# Patient Record
Sex: Female | Born: 1990 | Race: Black or African American | Hispanic: No | Marital: Single | Smoking: Never smoker
Health system: Southern US, Community
[De-identification: ages and names within clinical notes are randomized; demographics above are authoritative.]

## PROBLEM LIST (undated history)

## (undated) ENCOUNTER — Inpatient Hospital Stay (HOSPITAL_COMMUNITY): Payer: Self-pay

## (undated) DIAGNOSIS — Z9889 Other specified postprocedural states: Secondary | ICD-10-CM

## (undated) DIAGNOSIS — R87629 Unspecified abnormal cytological findings in specimens from vagina: Secondary | ICD-10-CM

## (undated) DIAGNOSIS — T8859XA Other complications of anesthesia, initial encounter: Secondary | ICD-10-CM

## (undated) DIAGNOSIS — R112 Nausea with vomiting, unspecified: Secondary | ICD-10-CM

## (undated) DIAGNOSIS — T4145XA Adverse effect of unspecified anesthetic, initial encounter: Secondary | ICD-10-CM

## (undated) HISTORY — DX: Unspecified abnormal cytological findings in specimens from vagina: R87.629

## (undated) HISTORY — PX: LEEP: SHX91

---
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
## 2015-09-27 ENCOUNTER — Ambulatory Visit
Admission: RE | Admit: 2015-09-27 | Discharge: 2015-09-27 | Disposition: A | Discharge status: Home / Self Care | Payer: No Typology Code available for payment source | Source: Ambulatory Visit | Attending: Infectious Disease | Admitting: Infectious Disease

## 2015-09-27 ENCOUNTER — Other Ambulatory Visit: Payer: Self-pay | Admitting: Infectious Disease

## 2015-09-27 DIAGNOSIS — Z111 Encounter for screening for respiratory tuberculosis: Secondary | ICD-10-CM

## 2016-01-27 NOTE — L&D Delivery Note (Signed)
Melody Zamora is a 26 y.o. G1P0 at [redacted]w[redacted]d. She presented with PPROM. No measurable cervix. She continued to have cramping and pain. At 2325 she vomited and felt an urge to push. She delivered a non-viable fetus. Fetus with cardiac activity, and some small movements of the arms and legs.  Placenta delivered intact at 2340. of cytotec given buccally.   0056 heart stopped beating.   Mom will be DC home after a short period of observation. Baby to morgue.    Thressa Sheller  11:51 PM 05/27/16

## 2016-04-27 DIAGNOSIS — R7612 Nonspecific reaction to cell mediated immunity measurement of gamma interferon antigen response without active tuberculosis: Secondary | ICD-10-CM | POA: Diagnosis not present

## 2016-04-27 DIAGNOSIS — Z3401 Encounter for supervision of normal first pregnancy, first trimester: Secondary | ICD-10-CM | POA: Diagnosis not present

## 2016-04-27 DIAGNOSIS — O99810 Abnormal glucose complicating pregnancy: Secondary | ICD-10-CM | POA: Diagnosis not present

## 2016-04-27 DIAGNOSIS — O99210 Obesity complicating pregnancy, unspecified trimester: Secondary | ICD-10-CM | POA: Diagnosis not present

## 2016-05-27 ENCOUNTER — Observation Stay (HOSPITAL_COMMUNITY)
Admission: AD | Admit: 2016-05-27 | Discharge: 2016-05-28 | Disposition: A | Discharge status: Home / Self Care | Payer: Medicaid Other | Source: Ambulatory Visit | Attending: Obstetrics & Gynecology | Admitting: Obstetrics & Gynecology

## 2016-05-27 ENCOUNTER — Inpatient Hospital Stay (HOSPITAL_COMMUNITY): Payer: Medicaid Other

## 2016-05-27 ENCOUNTER — Encounter (HOSPITAL_COMMUNITY): Payer: Self-pay | Admitting: *Deleted

## 2016-05-27 DIAGNOSIS — R7611 Nonspecific reaction to tuberculin skin test without active tuberculosis: Secondary | ICD-10-CM

## 2016-05-27 DIAGNOSIS — Z3A17 17 weeks gestation of pregnancy: Secondary | ICD-10-CM | POA: Insufficient documentation

## 2016-05-27 DIAGNOSIS — O42919 Preterm premature rupture of membranes, unspecified as to length of time between rupture and onset of labor, unspecified trimester: Secondary | ICD-10-CM | POA: Diagnosis present

## 2016-05-27 DIAGNOSIS — O039 Complete or unspecified spontaneous abortion without complication: Secondary | ICD-10-CM | POA: Diagnosis present

## 2016-05-27 DIAGNOSIS — R87619 Unspecified abnormal cytological findings in specimens from cervix uteri: Secondary | ICD-10-CM | POA: Insufficient documentation

## 2016-05-27 DIAGNOSIS — O42912 Preterm premature rupture of membranes, unspecified as to length of time between rupture and onset of labor, second trimester: Secondary | ICD-10-CM | POA: Diagnosis present

## 2016-05-27 DIAGNOSIS — O42012 Preterm premature rupture of membranes, onset of labor within 24 hours of rupture, second trimester: Secondary | ICD-10-CM | POA: Diagnosis not present

## 2016-05-27 DIAGNOSIS — Z603 Acculturation difficulty: Secondary | ICD-10-CM

## 2016-05-27 HISTORY — DX: Preterm premature rupture of membranes, unspecified as to length of time between rupture and onset of labor, unspecified trimester: O42.919

## 2016-05-27 HISTORY — DX: Nonspecific reaction to tuberculin skin test without active tuberculosis: R76.11

## 2016-05-27 LAB — CBC
HCT: 37.8 % (ref 36.0–46.0)
HEMOGLOBIN: 13.2 g/dL (ref 12.0–15.0)
MCH: 31.3 pg (ref 26.0–34.0)
MCHC: 34.9 g/dL (ref 30.0–36.0)
MCV: 89.6 fL (ref 78.0–100.0)
PLATELETS: 271 10*3/uL (ref 150–400)
RBC: 4.22 MIL/uL (ref 3.87–5.11)
RDW: 14.4 % (ref 11.5–15.5)
WBC: 14.1 10*3/uL — AB (ref 4.0–10.5)

## 2016-05-27 MED ORDER — MISOPROSTOL 200 MCG PO TABS
800.0000 ug | ORAL_TABLET | Freq: Once | ORAL | Status: AC
  Administered 2016-05-27: 800 ug via BUCCAL
  Filled 2016-05-27: qty 4

## 2016-05-27 MED ORDER — HYDROXYZINE HCL 50 MG PO TABS
50.0000 mg | ORAL_TABLET | Freq: Four times a day (QID) | ORAL | Status: DC | PRN
  Filled 2016-05-27: qty 1

## 2016-05-27 MED ORDER — NALBUPHINE HCL 10 MG/ML IJ SOLN: 5.0000 mg | INTRAMUSCULAR | Status: DC | PRN

## 2016-05-27 MED ORDER — PROMETHAZINE HCL 25 MG/ML IJ SOLN
12.5000 mg | Freq: Four times a day (QID) | INTRAMUSCULAR | Status: DC | PRN
Start: 2016-05-27 — End: 2016-05-28

## 2016-05-27 MED ORDER — SOD CITRATE-CITRIC ACID 500-334 MG/5ML PO SOLN: 30.0000 mL | ORAL | Status: DC | PRN

## 2016-05-27 MED ORDER — LIDOCAINE HCL (PF) 1 % IJ SOLN
30.0000 mL | INTRAMUSCULAR | Status: DC | PRN
  Filled 2016-05-27: qty 30

## 2016-05-27 MED ORDER — OXYCODONE-ACETAMINOPHEN 5-325 MG PO TABS: 1.0000 | ORAL_TABLET | ORAL | Status: DC | PRN

## 2016-05-27 MED ORDER — OXYTOCIN BOLUS FROM INFUSION
500.0000 mL | Freq: Once | INTRAVENOUS | Status: AC
  Administered 2016-05-28: 500 mL via INTRAVENOUS

## 2016-05-27 MED ORDER — PROMETHAZINE HCL 25 MG PO TABS
25.0000 mg | ORAL_TABLET | Freq: Once | ORAL | Status: AC
  Administered 2016-05-27: 25 mg via ORAL
  Filled 2016-05-27: qty 1

## 2016-05-27 MED ORDER — ONDANSETRON HCL 4 MG/2ML IJ SOLN
4.0000 mg | Freq: Four times a day (QID) | INTRAMUSCULAR | Status: DC | PRN
Start: 2016-05-27 — End: 2016-05-28

## 2016-05-27 MED ORDER — HYDROMORPHONE HCL 1 MG/ML IJ SOLN
0.5000 mg | Freq: Once | INTRAMUSCULAR | Status: AC
Start: 2016-05-27 — End: 2016-05-27
  Administered 2016-05-27: 0.5 mg via INTRAMUSCULAR
  Filled 2016-05-27: qty 1

## 2016-05-27 MED ORDER — ACETAMINOPHEN 325 MG PO TABS: 650.0000 mg | ORAL_TABLET | ORAL | Status: DC | PRN

## 2016-05-27 MED ORDER — OXYCODONE-ACETAMINOPHEN 5-325 MG PO TABS
2.0000 | ORAL_TABLET | ORAL | Status: DC | PRN
  Administered 2016-05-28: 2 via ORAL
  Filled 2016-05-27: qty 2

## 2016-05-27 MED ORDER — LACTATED RINGERS IV SOLN
INTRAVENOUS | Status: DC
  Administered 2016-05-27: via INTRAVENOUS

## 2016-05-27 MED ORDER — OXYTOCIN 40 UNITS IN LACTATED RINGERS INFUSION - SIMPLE MED
2.5000 [IU]/h | INTRAVENOUS | Status: DC
  Filled 2016-05-27: qty 1000

## 2016-05-27 MED ORDER — LACTATED RINGERS IV SOLN: 500.0000 mL | INTRAVENOUS | Status: DC | PRN

## 2016-05-27 NOTE — H&P (Signed)
Melody Zamora is a 26 y.o. female presenting for Leaking fluid and uterine cramping since this morning.  Has had care at [facility] Department  From [place].  Family is all there.  Is here with friend from work.  Patient states that she has not had intercourse in the last 24 hours.  . OB History    Gravida Para Term Preterm AB Living   1             SAB TAB Ectopic Multiple Live Births                 History reviewed. No pertinent past medical history. History reviewed. No pertinent surgical history. Family History: family history is not on file. Social History:  reports that she has never smoked. She has never used smokeless tobacco. She reports that she does not drink alcohol or use drugs.   Review of Systems  Constitutional: Negative for chills, fever and malaise/fatigue.  Respiratory: Negative for shortness of breath.   Cardiovascular: Negative for leg swelling.  Gastrointestinal: Positive for abdominal pain. Negative for constipation, diarrhea and vomiting.  Genitourinary: Negative for dysuria.       Fluid leaking and cramping started this morning  Neurological: Negative for dizziness and headaches.   Maternal Medical History:  Reason for admission: Rupture of membranes and contractions.   Contractions: Onset was 6-12 hours ago.   Frequency: regular.   Perceived severity is moderate.    Fetal activity: Perceived fetal activity is normal.   Last perceived fetal movement was within the past hour.    Prenatal complications: PIH.   No bleeding or pre-eclampsia.   Prenatal Complications - Diabetes: none.      Blood pressure 127/90, pulse 96, temperature 98.2 F (36.8 C), temperature source Oral, resp. rate 18, height 5' 4.5" (1.638 m), weight 259 lb (117.5 kg). Maternal Exam:  Uterine Assessment: Contraction strength is firm.  Contraction frequency is irregular.   Abdomen: Patient reports generalized tenderness.  Fundal height is 20.   Fetal presentation: no  presenting part  Introitus: Normal vulva. Vagina is positive for vaginal discharge (watery discharge, thin, light green/yellow, + Pooling, + ferning).  Ferning test: positive.  Nitrazine test: not done. Amniotic fluid character: clear and meconium stained.  Pelvis: adequate for delivery.   Cervix: Cervix evaluated by sterile speculum exam.     Fetal Exam Fetal Monitor Review: Mode: hand-held doppler probe.   Baseline rate: 143.      Physical Exam  Constitutional: She is oriented to person, place, and time. She appears well-developed and well-nourished. She appears distressed (tearful, uncomfortable, breathing through contractions).  HENT:  Head: Normocephalic.  Cardiovascular: Normal rate and regular rhythm.   Respiratory: Effort normal. No respiratory distress.  GI: She exhibits no distension. There is generalized tenderness. There is no rebound and no guarding.  Genitourinary: Vaginal discharge (watery discharge, thin, light green/yellow, + Pooling, + ferning) found.  Genitourinary Comments: Did not do digital exam Appears closed but difficult to see  Musculoskeletal: Normal range of motion.  Neurological: She is alert and oriented to person, place, and time.  Skin: Skin is warm and dry.  Psychiatric: She has a normal mood and affect.    Prenatal labs: ABO, Rh:   Antibody:   Rubella:   RPR:    HBsAg:    HIV:    GBS:     Assessment/Plan: Single IUP at [redacted]w[redacted]d by LMP PPROM Uterine cramping, appears to be laboring  Per Dr Despina Hidden get [place]  for measurements and fluid Probable admission   Wynelle Bourgeois 05/27/2016, 10:06 PM

## 2016-05-27 NOTE — MAU Note (Signed)
Pt reports leaking of fluid since 1100 am lower abdominal pain off and on throughout the day. Denies bleeding. Pain 9/10

## 2016-05-28 ENCOUNTER — Encounter (HOSPITAL_COMMUNITY): Payer: Self-pay | Admitting: *Deleted

## 2016-05-28 DIAGNOSIS — O42012 Preterm premature rupture of membranes, onset of labor within 24 hours of rupture, second trimester: Secondary | ICD-10-CM | POA: Diagnosis not present

## 2016-05-28 DIAGNOSIS — O039 Complete or unspecified spontaneous abortion without complication: Secondary | ICD-10-CM

## 2016-05-28 DIAGNOSIS — O42912 Preterm premature rupture of membranes, unspecified as to length of time between rupture and onset of labor, second trimester: Secondary | ICD-10-CM | POA: Diagnosis not present

## 2016-05-28 DIAGNOSIS — Z3A17 17 weeks gestation of pregnancy: Secondary | ICD-10-CM | POA: Diagnosis not present

## 2016-05-28 DIAGNOSIS — O42919 Preterm premature rupture of membranes, unspecified as to length of time between rupture and onset of labor, unspecified trimester: Secondary | ICD-10-CM

## 2016-05-28 HISTORY — DX: Complete or unspecified spontaneous abortion without complication: O03.9

## 2016-05-28 LAB — TYPE AND SCREEN
ABO/RH(D): O POS
Antibody Screen: NEGATIVE

## 2016-05-28 LAB — RPR: RPR: NONREACTIVE

## 2016-05-28 LAB — ABO/RH: ABO/RH(D): O POS

## 2016-05-28 MED ORDER — MISOPROSTOL 200 MCG PO TABS: 200.0000 ug | ORAL_TABLET | Freq: Four times a day (QID) | ORAL | 0 refills | Status: DC

## 2016-05-28 MED ORDER — OXYTOCIN 10 UNIT/ML IJ SOLN: 10.0000 [IU] | Freq: Once | INTRAMUSCULAR | Status: DC

## 2016-05-28 MED ORDER — KETOROLAC TROMETHAMINE 10 MG PO TABS: 10.0000 mg | ORAL_TABLET | Freq: Three times a day (TID) | ORAL | 0 refills | Status: DC | PRN

## 2016-05-28 MED ORDER — OXYTOCIN 10 UNIT/ML IJ SOLN
INTRAMUSCULAR | Status: AC
  Administered 2016-05-28: 10 [IU] via INTRAVENOUS
  Filled 2016-05-28: qty 1

## 2016-05-28 NOTE — Progress Notes (Addendum)
Patient is about 3.5 hours postpartum. She is still having a slight trickle of bleeding at this time, fundus firm. She has had cytotec and pitocin. Patient does not have a ride available to come at this time, and does not have anyone that can be with her at home at this time. Will put in obs on 3rd floor to continue to monitor bleeding. Patient has also expressed interested in seeing the chaplin as well. Will facilitate this from 3rd floor.   Will plan for DC later this morning as long as bleeding stabilizes.   Thressa ShellerHeather Leeya Rusconi  2:51 AM 05/28/16

## 2016-05-28 NOTE — Discharge Summary (Signed)
Physician Discharge Summary  Patient ID: Melody Zamora MRN: [id number] DOB/AGE: 22-Dec-1990 26 y.o.  Admit date: 05/27/2016 Discharge date: 05/28/2016  Admission Diagnoses: 17 week pregnancy loss  Discharge Diagnoses:  Active Problems:   Language barrier, cultural differences   Preterm premature rupture of membranes (PPROM) with unknown onset of labor   SAB (spontaneous abortion)   Discharged Condition: good  Hospital Course: admitted with PROM and active pregnancy loss Underwent spontaneous loss after admission with spontaneous delivery of placenta as well  Consults: None  Significant Diagnostic Studies: labs: Rh positive  Treatments:   Discharge Exam: Blood pressure (!) 101/54, pulse 71, temperature 98 F (36.7 C), temperature source Oral, resp. rate 18, height 5' 4.5" (1.638 m), weight 259 lb (117.5 kg), SpO2 100 %. General appearance: alert, cooperative and no distress GI: soft, non-tender; bowel sounds normal; no masses,  no organomegaly  Disposition: Final discharge disposition not confirmed  Discharge Instructions    Call MD for:  persistant nausea and vomiting    Complete by:  As directed    Call MD for:  severe uncontrolled pain    Complete by:  As directed    Call MD for:  temperature >100.4    Complete by:  As directed    Diet - low sodium heart healthy    Complete by:  As directed    Sexual acrtivity    Complete by:  As directed    No sex for 4 weeks     Allergies as of 05/28/2016   No Known Allergies     Medication List    TAKE these medications   ketorolac 10 MG tablet Commonly known as:  TORADOL Take 1 tablet (10 mg total) by mouth every 8 (eight) hours as needed.   misoprostol 200 MCG tablet Commonly known as:  CYTOTEC Take 1 tablet (200 mcg total) by mouth 4 (four) times daily.   multivitamin-prenatal 27-0.8 MG Tabs tablet Take 1 tablet by mouth daily at 12 noon.      Follow-up Information    [facility]  [facility]   Contact information: [address] [place] [id number] [phone]           Signed: Lazaro ArmsURE,Rabiah Goeser H 05/28/2016, 7:30 AM

## 2016-05-28 NOTE — Progress Notes (Signed)
Patient discharged home with friends. Discharged paperwork, teaching, prescriptions, and follow-up appts reviewed with patient. No questions at this time. Patient removed her own IV and left without hospital escort.

## 2016-05-28 NOTE — Discharge Instructions (Signed)

## 2016-05-29 ENCOUNTER — Encounter (HOSPITAL_COMMUNITY): Payer: Self-pay | Admitting: *Deleted

## 2016-06-01 ENCOUNTER — Ambulatory Visit: Payer: Self-pay | Admitting: Family Medicine

## 2016-07-16 ENCOUNTER — Other Ambulatory Visit (HOSPITAL_COMMUNITY)
Admission: RE | Admit: 2016-07-16 | Discharge: 2016-07-16 | Disposition: A | Discharge status: Home / Self Care | Payer: Medicaid Other | Source: Ambulatory Visit | Attending: Family Medicine | Admitting: Family Medicine

## 2016-07-16 ENCOUNTER — Ambulatory Visit (INDEPENDENT_AMBULATORY_CARE_PROVIDER_SITE_OTHER): Payer: Medicaid Other | Admitting: Family Medicine

## 2016-07-16 ENCOUNTER — Other Ambulatory Visit (HOSPITAL_COMMUNITY)
Admission: RE | Admit: 2016-07-16 | Discharge: 2016-07-16 | Disposition: A | Discharge status: Home / Self Care | Payer: Medicaid Other | Source: Ambulatory Visit | Attending: Obstetrics & Gynecology | Admitting: Obstetrics & Gynecology

## 2016-07-16 ENCOUNTER — Encounter: Payer: Self-pay | Admitting: Family Medicine

## 2016-07-16 VITALS — BP 135/77 | HR 62 | Wt 247.6 lb

## 2016-07-16 DIAGNOSIS — Z01411 Encounter for gynecological examination (general) (routine) with abnormal findings: Secondary | ICD-10-CM | POA: Diagnosis present

## 2016-07-16 DIAGNOSIS — R87612 Low grade squamous intraepithelial lesion on cytologic smear of cervix (LGSIL): Secondary | ICD-10-CM | POA: Diagnosis present

## 2016-07-16 DIAGNOSIS — Z3202 Encounter for pregnancy test, result negative: Secondary | ICD-10-CM

## 2016-07-16 DIAGNOSIS — Z124 Encounter for screening for malignant neoplasm of cervix: Secondary | ICD-10-CM

## 2016-07-16 LAB — POCT PREGNANCY, URINE: PREG TEST UR: NEGATIVE

## 2016-07-16 NOTE — Patient Instructions (Signed)
Colposcopy, Care After  This sheet gives you information about how to care for yourself after your procedure. Your doctor may also give you more specific instructions. If you have problems or questions, contact your doctor.  What can I expect after the procedure?  If you did not have a tissue sample removed (did not have a biopsy), you may only have some spotting for a few days. You can go back to your normal activities.  If you had a tissue sample removed, it is common to have:  · Soreness and pain. This may last for a few days.  · Light-headedness.  · Mild bleeding from your vagina or dark-colored, grainy discharge from your vagina. This may last for a few days. You may need to wear a sanitary pad.  · Spotting for at least 48 hours after the procedure.    Follow these instructions at home:  · Take over-the-counter and prescription medicines only as told by your doctor. Ask your doctor what medicines you can start taking again. This is very important if you take blood-thinning medicine.  · Do not drive or use heavy machinery while taking prescription pain medicine.  · For 3 days, or as long as your doctor tells you, avoid:  ? Douching.  ? Using tampons.  ? Having sex.  · If you use birth control (contraception), keep using it.  · Limit activity for the first day after the procedure. Ask your doctor what activities are safe for you.  · It is up to you to get the results of your procedure. Ask your doctor when your results will be ready.  · Keep all follow-up visits as told by your doctor. This is important.  Contact a doctor if:  · You get a skin rash.  Get help right away if:  · You are bleeding a lot from your vagina. It is a lot of bleeding if you are using more than one pad an hour for 2 hours in a row.  · You have clumps of blood (blood clots) coming from your vagina.  · You have a fever.  · You have chills  · You have pain in your lower belly (pelvic area).  · You have signs of infection, such as vaginal  discharge that is:  ? Different than usual.  ? Yellow.  ? Bad-smelling.  · You have very pain or cramps in your lower belly that do not get better with medicine.  · You feel light-headed.  · You feel dizzy.  · You pass out (faint).  Summary  · If you did not have a tissue sample removed (did not have a biopsy), you may only have some spotting for a few days. You can go back to your normal activities.  · If you had a tissue sample removed, it is common to have mild pain and spotting for 48 hours.  · For 3 days, or as long as your doctor tells you, avoid douching, using tampons and having sex.  · Get help right away if you have bleeding, very bad pain, or signs of infection.  This information is not intended to replace advice given to you by your health care provider. Make sure you discuss any questions you have with your health care provider.  Document Released: 07/01/2007 Document Revised: 10/02/2015 Document Reviewed: 10/02/2015  Elsevier Interactive Patient Education © 2018 Elsevier Inc.

## 2016-07-16 NOTE — Progress Notes (Signed)
GYNECOLOGY CLINIC COLPOSCOPY VISIT AND PROCEDURE NOTE  26 y.o. G1P0000 here for colposcopy for low-grade squamous intraepithelial neoplasia (LGSIL - encompassing HPV,mild dysplasia,CIN I) pap smear on 04/27/16, during early pregnancy. Prior cervical cytology and/or colposcopy findings: NONE. The patient reports the following prior treatments to the vulva/vagina/cervix: NONE. The patient is not a cigarette smoker. The patient is not immunosuppressed. The patient is not pregnant. The patient is not taking anticoagulants and is not allergic to iodine.  The risks (including infection, bleeding, pain) and benefits of the procedure were explained to the patient and written informed consent was obtained. Patient given informed consent, signed copy in the chart, time out was performed. Urine pregnancy test performed and confirmed to be negative.  Placed in lithotomy position. Repeat cervical cytology with HPV if indicated was obtained.   Gross findings:   Vagina: Normal mucosa  Vulva: Normal external genitalia without lesions  Cervix: Pink, nulliparous  Visualization after:  Acetic acid: Mosaicism noted around 6 o'clock to 9 o'clock  Lugol's solution: Satellite lesions at 12 o'clock and 2 o'clock  Green or blue filter: N/A  Upper one-third of vagina examined, revealing: normal vaginal mucosa  Biopsies obtained: Cervix at 12 o'clock, 2 o'clock, and 8 o'clock  ECC specimen obtained: YES  Colposcopy adequate? Yes  All specimens were labelled and sent to pathology.   Patient was given post procedure instructions.  Will follow up pathology and manage accordingly.      Jen MowElizabeth Mumaw, DO OB/GYN Fellow [facility](Faculty Practice)

## 2016-07-21 LAB — CYTOLOGY - PAP

## 2017-11-02 LAB — OB RESULTS CONSOLE RPR: RPR: NONREACTIVE

## 2017-11-02 LAB — OB RESULTS CONSOLE ANTIBODY SCREEN: ANTIBODY SCREEN: NEGATIVE

## 2017-11-02 LAB — OB RESULTS CONSOLE HEPATITIS B SURFACE ANTIGEN: HEP B S AG: NEGATIVE

## 2017-11-02 LAB — OB RESULTS CONSOLE HIV ANTIBODY (ROUTINE TESTING): HIV: NONREACTIVE

## 2017-11-02 LAB — OB RESULTS CONSOLE ABO/RH: RH TYPE: POSITIVE

## 2017-11-02 LAB — OB RESULTS CONSOLE RUBELLA ANTIBODY, IGM: Rubella: IMMUNE

## 2017-11-29 NOTE — H&P (Addendum)
Ghina Dimaria is a 27 y.o. female presenting for cervical cerclage. OB History    Gravida  1   Para  1   Term      Preterm      AB  0   Living  0     SAB  0   TAB      Ectopic      Multiple      Live Births  1          No past medical history on file. No past surgical history on file. Family History: family history is not on file. Social History:  reports that she has never smoked. She has never used smokeless tobacco. She reports that she does not drink alcohol or use drugs.     Maternal Diabetes: No Genetic Screening: Normal Maternal Ultrasounds/Referrals: Normal Fetal Ultrasounds or other Referrals:  None Maternal Substance Abuse:  No Significant Maternal Medications:  None Significant Maternal Lab Results:  None Other Comments:  None  ROS History   There were no vitals taken for this visit. Exam Physical Exam  Gen: well appearing, NAD CV: Reg rate Pulm: NWOB Abd: soft, nondistended, nontender, no masses GYN: uterus enlarged to appropriate gravid size, no adnexa ttp/CMT Ext: No edema b/l  Prenatal labs: ABO, Rh:  O+ Antibody:  neg Rubella:  immune RPR:   neg HBsAg:   neg HIV:   neg GBS:  unknown   Assessment/Plan: 27 yo G2P0020 @ 14. wga presenting for scheduled cerclage s/s second trimester loss at ~ 17 wga. Risks and benefits discussed in office including alternatives and patient elects history indicated cervical cerclage. Viability was confirmed at last office visit. G/C was negative at NOB visit. She has no absolute contraindications for a cerclage.  Risks of cerclage were discussed including chance of Infection, bleeding, ROM, suture migration, cervical dystocia/trauma, and failure. All questions answered and consent was given. No abx is indicated.   She is RH pos Proceed with cerclage.     Ranae Pila 11/29/2017, 9:32 AM  No updates to above H&P. Patient arrived NPO and was consented in PACU. Risks again discussed, all  questions answered, and consent signed. Proceed with above surgery.   Belva Agee MD

## 2017-12-01 ENCOUNTER — Telehealth (HOSPITAL_COMMUNITY): Payer: Self-pay | Admitting: *Deleted

## 2017-12-01 ENCOUNTER — Encounter (HOSPITAL_COMMUNITY): Payer: Self-pay | Admitting: *Deleted

## 2017-12-01 NOTE — Telephone Encounter (Signed)
Preadmission screen  

## 2017-12-07 ENCOUNTER — Telehealth (HOSPITAL_COMMUNITY): Payer: Self-pay | Admitting: *Deleted

## 2017-12-07 NOTE — Telephone Encounter (Signed)
Preadmission screen  

## 2017-12-08 ENCOUNTER — Encounter (HOSPITAL_COMMUNITY): Payer: Self-pay

## 2017-12-08 ENCOUNTER — Telehealth (HOSPITAL_COMMUNITY): Payer: Self-pay | Admitting: *Deleted

## 2017-12-08 NOTE — Telephone Encounter (Signed)
Preadmission screen  

## 2017-12-14 ENCOUNTER — Encounter (HOSPITAL_COMMUNITY)
Admission: RE | Admit: 2017-12-14 | Discharge: 2017-12-14 | Disposition: A | Discharge status: Home / Self Care | Payer: Medicaid Other | Source: Ambulatory Visit

## 2017-12-14 NOTE — Anesthesia Preprocedure Evaluation (Addendum)
Anesthesia Evaluation  Patient identified by MRN, date of birth, ID band Patient awake    Reviewed: Allergy & Precautions, NPO status , Patient's Chart, lab work & pertinent test results  History of Anesthesia Complications Negative for: history of anesthetic complications  Airway Mallampati: II  TM Distance: >3 FB Neck ROM: Full    Dental no notable dental hx. (+) Teeth Intact, Dental Advisory Given   Pulmonary neg pulmonary ROS,    Pulmonary exam normal breath sounds clear to auscultation       Cardiovascular negative cardio ROS Normal cardiovascular exam Rhythm:Regular Rate:Normal     Neuro/Psych negative neurological ROS     GI/Hepatic negative GI ROS, Neg liver ROS,   Endo/Other  Morbid obesityBMI 46.7  Renal/GU negative Renal ROS     Musculoskeletal negative musculoskeletal ROS (+)   Abdominal   Peds  Hematology negative hematology ROS (+)   Anesthesia Other Findings Day of surgery medications reviewed with the patient.  Reproductive/Obstetrics (+) Pregnancy                           Anesthesia Physical Anesthesia Plan  ASA: III  Anesthesia Plan: Spinal   Post-op Pain Management:    Induction:   PONV Risk Score and Plan: 2 and Treatment may vary due to age or medical condition  Airway Management Planned: Natural Airway and Nasal Cannula  Additional Equipment:   Intra-op Plan:   Post-operative Plan:   Informed Consent: I have reviewed the patients History and Physical, chart, labs and discussed the procedure including the risks, benefits and alternatives for the proposed anesthesia with the patient or authorized representative who has indicated his/her understanding and acceptance.   Dental advisory given  Plan Discussed with: CRNA  Anesthesia Plan Comments:        Anesthesia Quick Evaluation

## 2017-12-15 ENCOUNTER — Encounter (HOSPITAL_COMMUNITY)
Admission: RE | Disposition: A | Discharge status: Home / Self Care | Payer: Self-pay | Source: Ambulatory Visit | Attending: Obstetrics and Gynecology

## 2017-12-15 ENCOUNTER — Encounter (HOSPITAL_COMMUNITY): Payer: Self-pay | Admitting: *Deleted

## 2017-12-15 ENCOUNTER — Inpatient Hospital Stay (HOSPITAL_COMMUNITY): Payer: Medicaid Other | Admitting: Anesthesiology

## 2017-12-15 ENCOUNTER — Observation Stay (HOSPITAL_COMMUNITY)
Admission: RE | Admit: 2017-12-15 | Discharge: 2017-12-15 | Disposition: A | Discharge status: Home / Self Care | Payer: Medicaid Other | Source: Ambulatory Visit | Attending: Obstetrics and Gynecology | Admitting: Obstetrics and Gynecology

## 2017-12-15 DIAGNOSIS — O3430 Maternal care for cervical incompetence, unspecified trimester: Secondary | ICD-10-CM | POA: Diagnosis present

## 2017-12-15 DIAGNOSIS — Z3A17 17 weeks gestation of pregnancy: Secondary | ICD-10-CM | POA: Diagnosis not present

## 2017-12-15 DIAGNOSIS — N883 Incompetence of cervix uteri: Principal | ICD-10-CM | POA: Insufficient documentation

## 2017-12-15 HISTORY — PX: CERVICAL CERCLAGE: SHX1329

## 2017-12-15 HISTORY — DX: Maternal care for cervical incompetence, unspecified trimester: O34.30

## 2017-12-15 LAB — TYPE AND SCREEN
ABO/RH(D): O POS
Antibody Screen: NEGATIVE

## 2017-12-15 SURGERY — CERCLAGE, CERVIX, VAGINAL APPROACH
Anesthesia: Spinal | Site: Vagina | Wound class: Clean Contaminated

## 2017-12-15 MED ORDER — ACETAMINOPHEN 10 MG/ML IV SOLN
INTRAVENOUS | Status: AC
  Filled 2017-12-15: qty 100

## 2017-12-15 MED ORDER — OXYCODONE HCL 5 MG PO TABS: 5.0000 mg | ORAL_TABLET | Freq: Once | ORAL | Status: DC | PRN

## 2017-12-15 MED ORDER — ACETAMINOPHEN 325 MG PO TABS: 650.0000 mg | ORAL_TABLET | ORAL | Status: DC | PRN

## 2017-12-15 MED ORDER — OXYCODONE HCL 5 MG/5ML PO SOLN: 5.0000 mg | Freq: Once | ORAL | Status: DC | PRN

## 2017-12-15 MED ORDER — PRENATAL MULTIVITAMIN CH
1.0000 | ORAL_TABLET | Freq: Every day | ORAL | Status: DC
  Filled 2017-12-15 (×2): qty 1

## 2017-12-15 MED ORDER — CALCIUM CARBONATE ANTACID 500 MG PO CHEW
2.0000 | CHEWABLE_TABLET | ORAL | Status: DC | PRN
  Filled 2017-12-15: qty 2

## 2017-12-15 MED ORDER — PHENYLEPHRINE 40 MCG/ML (10ML) SYRINGE FOR IV PUSH (FOR BLOOD PRESSURE SUPPORT)
PREFILLED_SYRINGE | INTRAVENOUS | Status: AC
  Filled 2017-12-15: qty 10

## 2017-12-15 MED ORDER — PHENYLEPHRINE HCL 10 MG/ML IJ SOLN
INTRAMUSCULAR | Status: DC | PRN
  Administered 2017-12-15 (×2): 80 ug via INTRAVENOUS

## 2017-12-15 MED ORDER — ACETAMINOPHEN 10 MG/ML IV SOLN
1000.0000 mg | Freq: Once | INTRAVENOUS | Status: DC | PRN
  Administered 2017-12-15: 1000 mg via INTRAVENOUS

## 2017-12-15 MED ORDER — FENTANYL CITRATE (PF) 100 MCG/2ML IJ SOLN: 25.0000 ug | INTRAMUSCULAR | Status: DC | PRN

## 2017-12-15 MED ORDER — LACTATED RINGERS IV SOLN
INTRAVENOUS | Status: DC | PRN
  Administered 2017-12-15 (×2): via INTRAVENOUS

## 2017-12-15 MED ORDER — PROMETHAZINE HCL 25 MG/ML IJ SOLN: 6.2500 mg | INTRAMUSCULAR | Status: DC | PRN

## 2017-12-15 MED ORDER — BUPIVACAINE IN DEXTROSE 0.75-8.25 % IT SOLN
INTRATHECAL | Status: DC | PRN
  Administered 2017-12-15: 1.6 mL via INTRATHECAL

## 2017-12-15 SURGICAL SUPPLY — 13 items
CANISTER SUCT 3000ML PPV (MISCELLANEOUS) ×3 IMPLANT
GLOVE BIOGEL PI IND STRL 6.5 (GLOVE) ×1 IMPLANT
GLOVE BIOGEL PI IND STRL 7.0 (GLOVE) ×2 IMPLANT
GLOVE BIOGEL PI INDICATOR 6.5 (GLOVE) ×2
GLOVE BIOGEL PI INDICATOR 7.0 (GLOVE) ×4
GLOVE ECLIPSE 6.5 STRL STRAW (GLOVE) ×3 IMPLANT
GOWN STRL REUS W/TWL LRG LVL3 (GOWN DISPOSABLE) ×6 IMPLANT
PACK VAGINAL MINOR WOMEN LF (CUSTOM PROCEDURE TRAY) ×3 IMPLANT
PAD OB MATERNITY 4.3X12.25 (PERSONAL CARE ITEMS) ×3 IMPLANT
PAD PREP 24X48 CUFFED NSTRL (MISCELLANEOUS) ×3 IMPLANT
SUT MERSILENE 5MM BP 1 12 (SUTURE) ×3 IMPLANT
TOWEL OR 17X24 6PK STRL BLUE (TOWEL DISPOSABLE) ×6 IMPLANT
TRAY FOLEY W/BAG SLVR 14FR (SET/KITS/TRAYS/PACK) ×3 IMPLANT

## 2017-12-15 NOTE — Anesthesia Postprocedure Evaluation (Signed)
Anesthesia Post Note  Patient: Melody Zamora  Procedure(s) Performed: CERCLAGE CERVICAL (N/A Vagina )     Patient location during evaluation: PACU Anesthesia Type: Spinal Level of consciousness: awake and alert Pain management: pain level controlled Vital Signs Assessment: post-procedure vital signs reviewed and stable Respiratory status: spontaneous breathing, nonlabored ventilation and respiratory function stable Cardiovascular status: blood pressure returned to baseline and stable Postop Assessment: no apparent nausea or vomiting and spinal receding Anesthetic complications: no    Last Vitals:  Vitals:   12/15/17 1003 12/15/17 1013  BP:  97/62  Pulse: 73 78  Resp: (!) 28 18  Temp:  36.6 C  SpO2: 100% 100%    Last Pain:  Vitals:   12/15/17 1024  TempSrc:   PainSc: 0-No pain   Pain Goal:                 Kaylyn LayerKathryn E Darvell Monteforte

## 2017-12-15 NOTE — Transfer of Care (Signed)
Immediate Anesthesia Transfer of Care Note  Patient: Melody Zamora  Procedure(s) Performed: CERCLAGE CERVICAL (N/A Vagina )  Patient Location: PACU  Anesthesia Type:Spinal  Level of Consciousness: awake  Airway & Oxygen Therapy: Patient Spontanous Breathing  Post-op Assessment: Report given to RN  Post vital signs: Reviewed and stable  Last Vitals:  Vitals Value Taken Time  BP    Temp    Pulse    Resp    SpO2      Last Pain:  Vitals:   12/15/17 0647  TempSrc: Oral         Complications: No apparent anesthesia complications

## 2017-12-15 NOTE — Anesthesia Postprocedure Evaluation (Signed)
Anesthesia Post Note  Patient: Melody Zamora  Procedure(s) Performed: CERCLAGE CERVICAL (N/A Vagina )     Patient location during evaluation: [facility] Anesthesia Type: Spinal Level of consciousness: awake and alert Pain management: pain level controlled Vital Signs Assessment: post-procedure vital signs reviewed and stable Respiratory status: spontaneous breathing, nonlabored ventilation and respiratory function stable Cardiovascular status: stable Postop Assessment: no headache, no backache and epidural receding Anesthetic complications: no    Last Vitals:  Vitals:   12/15/17 1013 12/15/17 1200  BP: 97/62 108/63  Pulse: 78 98  Resp: 18 18  Temp: 36.6 C 36.8 C  SpO2: 100% 100%    Last Pain:  Vitals:   12/15/17 1200  TempSrc: Oral  PainSc:    Pain Goal:                 Melody Zamora

## 2017-12-15 NOTE — Addendum Note (Signed)
Addendum  created 12/15/17 1329 by Renford DillsMullins, Tabria Steines L, CRNA   Sign clinical note

## 2017-12-15 NOTE — Anesthesia Procedure Notes (Signed)
Spinal  Patient location during procedure: OR Start time: 12/15/2017 8:15 AM End time: 12/15/2017 8:20 AM Staffing Anesthesiologist: Kaylyn LayerHowze, Gennell How E, MD Performed: anesthesiologist  Preanesthetic Checklist Completed: patient identified, surgical consent, pre-op evaluation, timeout performed, IV checked, risks and benefits discussed and monitors and equipment checked Spinal Block Patient position: sitting Prep: site prepped and draped and DuraPrep Patient monitoring: cardiac monitor, continuous pulse ox and blood pressure Approach: midline Location: L2-3 Injection technique: single-shot Needle Needle type: Pencan  Needle gauge: 24 G Needle length: 9 cm Additional Notes Risks, benefits, and alternative discussed. Patient gave consent to procedure. Prepped and draped in sitting position. First attempt at L3-4 difficult due to pt positioning, 2 needle passes unsuccessful. Next attempt at L2-3 with clear CSF obtained after 2 needle redirections. Positive terminal aspiration. No pain or paraesthesias with injection. Patient tolerated procedure well. Vital signs stable. Melody GreenhouseKE [name], MD

## 2017-12-15 NOTE — Op Note (Addendum)
PREOPERATIVE DIAGNOSES: 1. Cervical incompetence  POSTOPERATIVE DIAGNOSES: 1. same  PROCEDURE PERFORMED: Cervical cerclage - history indicated  SURGEON: Dr. Belva AgeeElise Myli Pae ASSISTANT: None  ANESTHESIA: General   ESTIMATED BLOOD LOSS: Minimal.  URINE OUTPUT: 200cc of clear urine at the end of the procedure.  COMPLICATIONS: None  Indications:  27 yo G3P0 presenting for cervical cerclage s/s history indicated. Viability was confirmed at last office visit. G/C was negative at NOB visit. She has no absolute contraindications for a cerclage.  Risks of cerclage were discussed including chance of Infection, bleeding, ROM, suture migration, cervical dystocia/trauma, and failure. All questions answered and consent was given. No abx is indicated.   She is RH positive   FINDINGS: On exam, under anesthesia, normal appearing vulva and vagina, and no cervical dilation  Procedure: FHT noted. A spinal anethesia was placed.  The patient was prepared and draped in the usual sterile manner for a vaginal procedure. A foley was placed A weighted speculum was placed in the posterior vaginal vault. The cervix was grasped with a ring forceps.  A curved needle loaded with mersilene tape #1 was placed at 12 o'clock, at the junction of the rugated vaginal epithelium and the smooth cervix just distal to the vesicocervical reflection and at least 2 cm above the external os.  Four passes of a purse-string suture were taken circumferentially around the entire cervix as high as safely possible, avoiding the bladder, rectum, and uterine vessels. Approximately 1 cm of space was left between the exit of one pass and the entry of the next pass. The two ends of the suture were then tied securely and cut, leaving the ends long enough to grasp with a clamp when it is time to remove it. The knot was at 4 o'clock. Hemostasis was achieved.  The sponge and lap counts were correct times 2 at this time. The patient's procedure was  terminated. We then awakened her. She was sent to the Recovery Room in good condition.    Belva AgeeElise Bruno Leach MD

## 2017-12-15 NOTE — Brief Op Note (Signed)
12/15/2017  8:54 AM  PATIENT:  Melody Zamora  27 y.o. female  PRE-OPERATIVE DIAGNOSIS:  CERVICAL INCOMPETENCE  POST-OPERATIVE DIAGNOSIS:  CERVICAL INCOMPETENCE  PROCEDURE:  Procedure(s) with comments: CERCLAGE CERVICAL (N/A) - spoke w/ Lisa-OK at 8:30am, BS Virtual Rm 2  SURGEON:  Surgeon(s) and Role:    * Soraiya Ahner, Madelaine EtienneElise Jennifer, MD - Primary  PHYSICIAN ASSISTANT:   ASSISTANTS: none   ANESTHESIA:   spinal  EBL:  Minimal   BLOOD ADMINISTERED:none  DRAINS: Urinary Catheter (Foley)   LOCAL MEDICATIONS USED:  NONE  SPECIMEN:  No Specimen  DISPOSITION OF SPECIMEN:  N/A  COUNTS:  YES  TOURNIQUET:  * No tourniquets in log *  DICTATION: .Note written in EPIC  PLAN OF CARE: Discharge to home after PACU  PATIENT DISPOSITION:  PACU - hemodynamically stable.   Delay start of Pharmacological VTE agent (>24hrs) due to surgical blood loss or risk of bleeding: not applicable

## 2017-12-16 NOTE — Discharge Summary (Signed)
Obstetric Discharge Summary Reason for Admission: cerclage Prenatal Procedures: cerclage She was admitted for a cerclage which was placed without complication. She recovered and + FHT noted before and after.  Hemoglobin  Date Value Ref Range Status  05/27/2016 13.2 12.0 - 15.0 g/dL Final   HCT  Date Value Ref Range Status  05/27/2016 37.8 36.0 - 46.0 % Final    Physical Exam:  General: alert, cooperative and appears stated age 79Lochia: n/a Uterine Fundus: gravid GYN  No bleeding DVT Evaluation: No evidence of DVT seen on physical exam. Negative Homan's sign. No cords or calf tenderness. No significant calf/ankle edema.  Discharge Diagnoses: s/p cerclage  Discharge Information: Date: 12/16/2017 Activity: pelvic rest Diet: routine Medications: None Condition: stable Instructions: refer to practice specific booklet Discharge to: home    Ranae Pilalise Jennifer Leger 12/16/2017, 5:10 AM

## 2018-01-07 ENCOUNTER — Other Ambulatory Visit: Payer: Self-pay

## 2018-01-07 ENCOUNTER — Inpatient Hospital Stay (HOSPITAL_COMMUNITY)
Admission: AD | Admit: 2018-01-07 | Discharge: 2018-01-07 | Disposition: A | Discharge status: Home / Self Care | Payer: Self-pay | Attending: Obstetrics and Gynecology | Admitting: Obstetrics and Gynecology

## 2018-01-07 NOTE — MAU Note (Signed)
Pt sent on to appropriate location by V Aundria Rudogers CNM ([facility]

## 2018-01-07 NOTE — MAU Note (Signed)
Pt was at home.  Dr Elon SpannerLeger called- told her to come to [facility], some dr wanted to see her because her cervix is still short.  Cerclage placed a month ago. No pain, bleeding or leaking.

## 2018-01-14 DIAGNOSIS — Z3686 Encounter for antenatal screening for cervical length: Secondary | ICD-10-CM | POA: Diagnosis not present

## 2018-01-14 DIAGNOSIS — Z3A18 18 weeks gestation of pregnancy: Secondary | ICD-10-CM | POA: Diagnosis not present

## 2018-01-14 DIAGNOSIS — O99212 Obesity complicating pregnancy, second trimester: Secondary | ICD-10-CM | POA: Diagnosis not present

## 2018-01-14 DIAGNOSIS — Z363 Encounter for antenatal screening for malformations: Secondary | ICD-10-CM | POA: Diagnosis not present

## 2018-01-14 DIAGNOSIS — O09292 Supervision of pregnancy with other poor reproductive or obstetric history, second trimester: Secondary | ICD-10-CM | POA: Diagnosis not present

## 2018-01-14 DIAGNOSIS — O3432 Maternal care for cervical incompetence, second trimester: Secondary | ICD-10-CM | POA: Diagnosis not present

## 2018-01-14 DIAGNOSIS — E669 Obesity, unspecified: Secondary | ICD-10-CM | POA: Diagnosis not present

## 2018-01-14 DIAGNOSIS — O26872 Cervical shortening, second trimester: Secondary | ICD-10-CM | POA: Diagnosis not present

## 2018-01-18 ENCOUNTER — Observation Stay (HOSPITAL_COMMUNITY): Payer: Medicaid Other | Admitting: Anesthesiology

## 2018-01-18 ENCOUNTER — Observation Stay (HOSPITAL_COMMUNITY)
Admission: AD | Admit: 2018-01-18 | Discharge: 2018-01-19 | Disposition: A | Discharge status: Home / Self Care | Payer: Medicaid Other | Attending: Obstetrics and Gynecology | Admitting: Obstetrics and Gynecology

## 2018-01-18 ENCOUNTER — Inpatient Hospital Stay (HOSPITAL_BASED_OUTPATIENT_CLINIC_OR_DEPARTMENT_OTHER): Payer: Medicaid Other

## 2018-01-18 ENCOUNTER — Encounter (HOSPITAL_COMMUNITY)
Admission: AD | Disposition: A | Discharge status: Home / Self Care | Payer: Self-pay | Source: Home / Self Care | Attending: Obstetrics and Gynecology

## 2018-01-18 ENCOUNTER — Other Ambulatory Visit: Payer: Self-pay

## 2018-01-18 ENCOUNTER — Encounter (HOSPITAL_COMMUNITY): Payer: Self-pay | Admitting: *Deleted

## 2018-01-18 DIAGNOSIS — O26892 Other specified pregnancy related conditions, second trimester: Secondary | ICD-10-CM | POA: Diagnosis not present

## 2018-01-18 DIAGNOSIS — O3432 Maternal care for cervical incompetence, second trimester: Principal | ICD-10-CM | POA: Diagnosis present

## 2018-01-18 DIAGNOSIS — O09292 Supervision of pregnancy with other poor reproductive or obstetric history, second trimester: Secondary | ICD-10-CM | POA: Diagnosis not present

## 2018-01-18 DIAGNOSIS — Z3A19 19 weeks gestation of pregnancy: Secondary | ICD-10-CM

## 2018-01-18 DIAGNOSIS — Z8249 Family history of ischemic heart disease and other diseases of the circulatory system: Secondary | ICD-10-CM | POA: Insufficient documentation

## 2018-01-18 DIAGNOSIS — Z79890 Hormone replacement therapy (postmenopausal): Secondary | ICD-10-CM | POA: Diagnosis not present

## 2018-01-18 DIAGNOSIS — N898 Other specified noninflammatory disorders of vagina: Secondary | ICD-10-CM

## 2018-01-18 DIAGNOSIS — O2292 Venous complication in pregnancy, unspecified, second trimester: Secondary | ICD-10-CM | POA: Diagnosis not present

## 2018-01-18 DIAGNOSIS — O42012 Preterm premature rupture of membranes, onset of labor within 24 hours of rupture, second trimester: Secondary | ICD-10-CM | POA: Diagnosis not present

## 2018-01-18 DIAGNOSIS — O021 Missed abortion: Secondary | ICD-10-CM | POA: Diagnosis not present

## 2018-01-18 HISTORY — PX: DILATION AND EVACUATION: SHX1459

## 2018-01-18 HISTORY — DX: Maternal care for cervical incompetence, second trimester: O34.32

## 2018-01-18 LAB — COMPREHENSIVE METABOLIC PANEL
ALBUMIN: 3.4 g/dL — AB (ref 3.5–5.0)
ALT: 32 U/L (ref 0–44)
ANION GAP: 9 (ref 5–15)
AST: 24 U/L (ref 15–41)
Alkaline Phosphatase: 63 U/L (ref 38–126)
BUN: 6 mg/dL (ref 6–20)
CALCIUM: 8.9 mg/dL (ref 8.9–10.3)
CO2: 19 mmol/L — ABNORMAL LOW (ref 22–32)
Chloride: 105 mmol/L (ref 98–111)
Creatinine, Ser: 0.62 mg/dL (ref 0.44–1.00)
GFR calc Af Amer: >60 mL/min (ref >60)
GFR calc non Af Amer: >60 mL/min (ref >60)
GLUCOSE: 103 mg/dL — AB (ref 70–99)
Potassium: 4 mmol/L (ref 3.5–5.1)
Sodium: 133 mmol/L — ABNORMAL LOW (ref 135–145)
Total Bilirubin: 0.7 mg/dL (ref 0.3–1.2)
Total Protein: 6.8 g/dL (ref 6.5–8.1)

## 2018-01-18 LAB — WET PREP, GENITAL
CLUE CELLS WET PREP: NONE SEEN
Sperm: NONE SEEN
Trich, Wet Prep: NONE SEEN
Yeast Wet Prep HPF POC: NONE SEEN

## 2018-01-18 LAB — CBC
HEMATOCRIT: 36.1 % (ref 36.0–46.0)
HEMOGLOBIN: 12.3 g/dL (ref 12.0–15.0)
MCH: 31.4 pg (ref 26.0–34.0)
MCHC: 34.1 g/dL (ref 30.0–36.0)
MCV: 92.1 fL (ref 80.0–100.0)
Platelets: 271 10*3/uL (ref 150–400)
RBC: 3.92 MIL/uL (ref 3.87–5.11)
RDW: 14.4 % (ref 11.5–15.5)
WBC: 14.7 10*3/uL — AB (ref 4.0–10.5)
nRBC: 0 % (ref 0.0–0.2)

## 2018-01-18 LAB — POCT FERN TEST: POCT Fern Test: POSITIVE

## 2018-01-18 LAB — TYPE AND SCREEN
ABO/RH(D): O POS
Antibody Screen: NEGATIVE

## 2018-01-18 SURGERY — DILATION AND EVACUATION, UTERUS
Anesthesia: General | Wound class: Clean Contaminated

## 2018-01-18 MED ORDER — ONDANSETRON HCL 4 MG/2ML IJ SOLN
INTRAMUSCULAR | Status: AC
  Filled 2018-01-18: qty 2

## 2018-01-18 MED ORDER — LACTATED RINGERS IV SOLN: INTRAVENOUS | Status: DC

## 2018-01-18 MED ORDER — LIDOCAINE HCL (CARDIAC) PF 100 MG/5ML IV SOSY
PREFILLED_SYRINGE | INTRAVENOUS | Status: DC | PRN
  Administered 2018-01-18: 100 mg via INTRAVENOUS

## 2018-01-18 MED ORDER — DEXAMETHASONE SODIUM PHOSPHATE 4 MG/ML IJ SOLN
INTRAMUSCULAR | Status: DC | PRN
  Administered 2018-01-18: 4 mg via INTRAVENOUS

## 2018-01-18 MED ORDER — DEXAMETHASONE SODIUM PHOSPHATE 4 MG/ML IJ SOLN
INTRAMUSCULAR | Status: AC
  Filled 2018-01-18: qty 1

## 2018-01-18 MED ORDER — OXYTOCIN 10 UNIT/ML IJ SOLN
INTRAMUSCULAR | Status: AC
  Filled 2018-01-18: qty 2

## 2018-01-18 MED ORDER — DEXTROSE 5 % IV SOLN
3.0000 g | INTRAVENOUS | Status: AC
  Administered 2018-01-18: 3 g via INTRAVENOUS
  Filled 2018-01-18: qty 3000

## 2018-01-18 MED ORDER — MIDAZOLAM HCL 2 MG/2ML IJ SOLN
INTRAMUSCULAR | Status: DC | PRN
  Administered 2018-01-18: 2 mg via INTRAVENOUS

## 2018-01-18 MED ORDER — PROPOFOL 10 MG/ML IV BOLUS
INTRAVENOUS | Status: AC
  Filled 2018-01-18: qty 20

## 2018-01-18 MED ORDER — ONDANSETRON HCL 4 MG PO TABS: 4.0000 mg | ORAL_TABLET | Freq: Four times a day (QID) | ORAL | Status: DC | PRN

## 2018-01-18 MED ORDER — METHYLERGONOVINE MALEATE 0.2 MG/ML IJ SOLN
INTRAMUSCULAR | Status: AC
  Filled 2018-01-18: qty 1

## 2018-01-18 MED ORDER — OXYTOCIN 10 UNIT/ML IJ SOLN
INTRAMUSCULAR | Status: DC | PRN
  Administered 2018-01-18: 20 [IU] via INTRAMUSCULAR

## 2018-01-18 MED ORDER — SUCCINYLCHOLINE CHLORIDE 20 MG/ML IJ SOLN
INTRAMUSCULAR | Status: DC | PRN
  Administered 2018-01-18: 140 mg via INTRAVENOUS

## 2018-01-18 MED ORDER — FENTANYL CITRATE (PF) 250 MCG/5ML IJ SOLN
INTRAMUSCULAR | Status: AC
  Filled 2018-01-18: qty 5

## 2018-01-18 MED ORDER — FENTANYL CITRATE (PF) 100 MCG/2ML IJ SOLN: 25.0000 ug | INTRAMUSCULAR | Status: DC | PRN

## 2018-01-18 MED ORDER — HYDROMORPHONE HCL 1 MG/ML IJ SOLN
0.2000 mg | INTRAMUSCULAR | Status: DC | PRN
  Administered 2018-01-18: 0.5 mg via INTRAVENOUS
  Filled 2018-01-18 (×2): qty 1

## 2018-01-18 MED ORDER — PHENYLEPHRINE 40 MCG/ML (10ML) SYRINGE FOR IV PUSH (FOR BLOOD PRESSURE SUPPORT)
PREFILLED_SYRINGE | INTRAVENOUS | Status: AC
  Filled 2018-01-18: qty 10

## 2018-01-18 MED ORDER — MIDAZOLAM HCL 2 MG/2ML IJ SOLN
INTRAMUSCULAR | Status: AC
  Filled 2018-01-18: qty 2

## 2018-01-18 MED ORDER — PROPOFOL 10 MG/ML IV BOLUS
INTRAVENOUS | Status: DC | PRN
  Administered 2018-01-18: 200 mg via INTRAVENOUS

## 2018-01-18 MED ORDER — ONDANSETRON HCL 4 MG/2ML IJ SOLN
4.0000 mg | Freq: Four times a day (QID) | INTRAMUSCULAR | Status: DC | PRN
  Administered 2018-01-18: 4 mg via INTRAVENOUS

## 2018-01-18 MED ORDER — FENTANYL CITRATE (PF) 100 MCG/2ML IJ SOLN
INTRAMUSCULAR | Status: DC | PRN
  Administered 2018-01-18 (×3): 50 ug via INTRAVENOUS

## 2018-01-18 MED ORDER — PROMETHAZINE HCL 25 MG/ML IJ SOLN: 6.2500 mg | INTRAMUSCULAR | Status: DC | PRN

## 2018-01-18 MED ORDER — LACTATED RINGERS IV SOLN
INTRAVENOUS | Status: DC
  Administered 2018-01-18: 16:00:00 via INTRAVENOUS

## 2018-01-18 MED ORDER — OXYTOCIN 40 UNITS IN LACTATED RINGERS INFUSION - SIMPLE MED
INTRAVENOUS | Status: AC
  Filled 2018-01-18: qty 1000

## 2018-01-18 MED ORDER — ACETAMINOPHEN 10 MG/ML IV SOLN
INTRAVENOUS | Status: DC | PRN
  Administered 2018-01-18: 1000 mg via INTRAVENOUS

## 2018-01-18 MED ORDER — LIDOCAINE HCL (CARDIAC) PF 100 MG/5ML IV SOSY
PREFILLED_SYRINGE | INTRAVENOUS | Status: AC
  Filled 2018-01-18: qty 5

## 2018-01-18 MED ORDER — LACTATED RINGERS IV BOLUS
1000.0000 mL | Freq: Once | INTRAVENOUS | Status: AC
  Administered 2018-01-18: 1000 mL via INTRAVENOUS

## 2018-01-18 MED ORDER — MISOPROSTOL 200 MCG PO TABS
ORAL_TABLET | ORAL | Status: AC
  Filled 2018-01-18: qty 3

## 2018-01-18 MED ORDER — HYDROMORPHONE HCL 1 MG/ML IJ SOLN
0.5000 mg | Freq: Once | INTRAMUSCULAR | Status: AC
  Administered 2018-01-18: 0.5 mg via INTRAVENOUS
  Filled 2018-01-18: qty 0.5

## 2018-01-18 MED ORDER — ZOLPIDEM TARTRATE 5 MG PO TABS: 5.0000 mg | ORAL_TABLET | Freq: Every evening | ORAL | Status: DC | PRN

## 2018-01-18 MED ORDER — PRENATAL MULTIVITAMIN CH: 1.0000 | ORAL_TABLET | Freq: Every day | ORAL | Status: DC

## 2018-01-18 MED ORDER — ACETAMINOPHEN 10 MG/ML IV SOLN
INTRAVENOUS | Status: AC
  Filled 2018-01-18: qty 100

## 2018-01-18 MED ORDER — SUCCINYLCHOLINE CHLORIDE 20 MG/ML IJ SOLN: INTRAMUSCULAR | Status: DC | PRN

## 2018-01-18 MED ORDER — ACETAMINOPHEN 325 MG PO TABS: 650.0000 mg | ORAL_TABLET | ORAL | Status: DC | PRN

## 2018-01-18 MED ORDER — DEXTROSE 5 % IV SOLN
INTRAVENOUS | Status: AC
  Filled 2018-01-18: qty 3000

## 2018-01-18 MED ORDER — METHYLERGONOVINE MALEATE 0.2 MG/ML IJ SOLN
INTRAMUSCULAR | Status: DC | PRN
  Administered 2018-01-18: 0.2 mg via INTRAMUSCULAR

## 2018-01-18 SURGICAL SUPPLY — 22 items
CATH ROBINSON RED A/P 16FR (CATHETERS) ×3 IMPLANT
CLOTH BEACON ORANGE TIMEOUT ST (SAFETY) ×3 IMPLANT
DECANTER SPIKE VIAL GLASS SM (MISCELLANEOUS) ×3 IMPLANT
GLOVE BIO SURGEON STRL SZ7.5 (GLOVE) ×6 IMPLANT
GLOVE BIOGEL PI IND STRL 7.0 (GLOVE) ×1 IMPLANT
GLOVE BIOGEL PI IND STRL 8 (GLOVE) ×1 IMPLANT
GLOVE BIOGEL PI INDICATOR 7.0 (GLOVE) ×2
GLOVE BIOGEL PI INDICATOR 8 (GLOVE) ×2
GOWN STRL REUS W/TWL LRG LVL3 (GOWN DISPOSABLE) ×6 IMPLANT
KIT BERKELEY 1ST TRIMESTER 3/8 (MISCELLANEOUS) ×3 IMPLANT
NS IRRIG 1000ML POUR BTL (IV SOLUTION) ×3 IMPLANT
PACK VAGINAL MINOR WOMEN LF (CUSTOM PROCEDURE TRAY) ×3 IMPLANT
PAD OB MATERNITY 4.3X12.25 (PERSONAL CARE ITEMS) ×3 IMPLANT
PAD PREP 24X48 CUFFED NSTRL (MISCELLANEOUS) ×3 IMPLANT
SET BERKELEY SUCTION TUBING (SUCTIONS) ×3 IMPLANT
SUT VIC AB 0 CT1 27 (SUTURE) ×4
SUT VIC AB 0 CT1 27XBRD ANBCTR (SUTURE) ×2 IMPLANT
TOWEL OR 17X24 6PK STRL BLUE (TOWEL DISPOSABLE) ×6 IMPLANT
VACURETTE 10 RIGID CVD (CANNULA) ×3 IMPLANT
VACURETTE 7MM CVD STRL WRAP (CANNULA) IMPLANT
VACURETTE 8 RIGID CVD (CANNULA) IMPLANT
VACURETTE 9 RIGID CVD (CANNULA) IMPLANT

## 2018-01-18 NOTE — MAU Note (Signed)
Sent from Dr's office, had exam, was told she is dilating

## 2018-01-18 NOTE — Progress Notes (Signed)
CTSP re: IUFD delivery  Patient delivered and cord clamped and cut by the time I arrived. Approximately 3cm of cord coming out of vagina and approximately 200mL of blood on pad and bleeding is minimal. Placenta not in vagina and still high and what feels to be stitch remnant felt on cervix on lower part. Bleeding minimal and I asked nursing to get speculum, gloves, light and bed pad to do as needed speculum exam before primary MD comes. I told RN to let me know if bleeding picks up and I can come back and reassess.  Cornelia Copaharlie Alasia Enge, Jr MD Attending [facility]) 01/18/2018 Time: [phone]

## 2018-01-18 NOTE — H&P (Signed)
Melody Zamora is an 27 y.o. female G3P0 @ 19 1/7 weeks with cervical insufficiency. This pregnancy had cerclage placed about 12-14 weeks. Was noted to have a thinning cervix and a [facility]. Yesterday cervical check noted cervix to be closed. Today in office for routine progesterone injection and C/O cramping pain. Cervix then checked and per Dr Elon SpannerLeger 4-5 cm with bulging BOW. Patient continues to C/O of some pelvic cramps.   Pertinent Gynecological History: Menses: N/A Bleeding: none Contraception: none DES exposure: denies Blood transfusions: none Sexually transmitted diseases: no past history Previous GYN Procedures: as above  Last mammogram: N/A Date: N/A Last pap: unknown Date: N/A OB History: G3, P0   Menstrual History: Menarche age: unknown No LMP recorded. Patient is pregnant.    Past Medical History:  Diagnosis Date  . Vaginal Pap smear, abnormal     Past Surgical History:  Procedure Laterality Date  . CERVICAL CERCLAGE N/A 12/15/2017   Procedure: CERCLAGE CERVICAL;  Surgeon: Ranae PilaLeger, Elise Jennifer, MD;  [facility];  Service: Gynecology;  Laterality: N/A;  spoke w/ Lisa-OK at 8:30am, BS Virtual Rm 2  . LEEP      Family History  Problem Relation Age of Onset  . Hypertension Mother     Social History:  reports that she has never smoked. She has never used smokeless tobacco. She reports that she does not drink alcohol or use drugs.  Allergies: No Known Allergies  Medications Prior to Admission  Medication Sig Dispense Refill Last Dose  . Doxylamine-Pyridoxine ER (BONJESTA) 20-20 MG TBCR Take 1 tablet by mouth daily as needed (N/V due to pregnancy).     . Prenatal Vit-Fe Fumarate-FA (MULTIVITAMIN-PRENATAL) 27-0.8 MG TABS tablet Take 1 tablet by mouth daily at 12 noon.   Taking    Review of Systems  Constitutional: Negative for fever.  Eyes: Negative for blurred vision.    Blood pressure (!) 113/50,  pulse (!) 108, temperature 98.6 F (37 C), resp. rate 18, weight 127.9 kg, SpO2 100 %. Physical Exam  Cardiovascular: Normal rate and regular rhythm.  Respiratory: Effort normal and breath sounds normal.  GI: Soft. There is no abdominal tenderness.    Results for orders placed or performed during the hospital encounter of 01/18/18 (from the past 24 hour(s))  CBC     Status: Abnormal   Collection Time: 01/18/18  1:44 PM  Result Value Ref Range   WBC 14.7 (H) 4.0 - 10.5 K/uL   RBC 3.92 3.87 - 5.11 MIL/uL   Hemoglobin 12.3 12.0 - 15.0 g/dL   HCT 30.836.1 65.736.0 - 84.646.0 %   MCV 92.1 80.0 - 100.0 fL   MCH 31.4 26.0 - 34.0 pg   MCHC 34.1 30.0 - 36.0 g/dL   RDW 96.214.4 95.211.5 - 84.115.5 %   Platelets 271 150 - 400 K/uL   nRBC 0.0 0.0 - 0.2 %  Comprehensive metabolic panel     Status: Abnormal   Collection Time: 01/18/18  1:44 PM  Result Value Ref Range   Sodium 133 (L) 135 - 145 mmol/L   Potassium 4.0 3.5 - 5.1 mmol/L   Chloride 105 98 - 111 mmol/L   CO2 19 (L) 22 - 32 mmol/L   Glucose, Bld 103 (H) 70 - 99 mg/dL   BUN 6 6 - 20 mg/dL   Creatinine, Ser 3.240.62 0.44 - 1.00 mg/dL   Calcium 8.9 8.9 - 40.110.3 mg/dL   Total Protein 6.8 6.5 -  8.1 g/dL   Albumin 3.4 (L) 3.5 - 5.0 g/dL   AST 24 15 - 41 U/L   ALT 32 0 - 44 U/L   Alkaline Phosphatase 63 38 - 126 U/L   Total Bilirubin 0.7 0.3 - 1.2 mg/dL   GFR calc non Af Amer >60 >60 mL/min   GFR calc Af Amer >60 >60 mL/min   Anion gap 9 5 - 15  POCT fern test     Status: None   Collection Time: 01/18/18  1:53 PM  Result Value Ref Range   POCT Fern Test Positive = ruptured amniotic membanes   Wet prep, genital     Status: Abnormal   Collection Time: 01/18/18  1:55 PM  Result Value Ref Range   Yeast Wet Prep HPF POC NONE SEEN NONE SEEN   Trich, Wet Prep NONE SEEN NONE SEEN   Clue Cells Wet Prep HPF POC NONE SEEN NONE SEEN   WBC, Wet Prep HPF POC MODERATE (A) NONE SEEN   Sperm NONE SEEN     [place] Mfm Ob Limited  Result Date:  01/18/2018 ----------------------------------------------------------------------  OBSTETRICS REPORT                       (Signed Final 01/18/2018 02:59 pm) ---------------------------------------------------------------------- Patient Info  ID #:       [id number]                          D.O.B.:  November 15, 1990 (27 yrs)  Name:       Melody Zamora                  Visit Date: 01/18/2018 02:42 pm ---------------------------------------------------------------------- Performed By  Performed By:     Marcellina MillinKelly L Moser          Ref. Address:     [address]                    [address]                                                             [address]                                                             [place]                                                             [id number]  Attending:        Noralee Spaceavi Shankar MD        Secondary Phy.:   MAU Nursing-  MAU/Triage  Referred By:      Harold Hedge          Location:         [facility]                    MD ---------------------------------------------------------------------- Orders   #  Description                          Code         Ordered By   1  [place] MFM OB LIMITED                    16109.60     Judeth Horn  ----------------------------------------------------------------------   #  Order #                    Accession #                 Episode #   1  [id number]                  [id number]                  [id number]  ---------------------------------------------------------------------- Indications   Cervical cerclage suture present, second       O34.32   trimester   [redacted] weeks gestation of pregnancy                Z3A.19   Vaginal discharge during pregnancy in          O26.892   second trimester   Poor obstetric history: Previous midtrimester  O09.299   loss  ---------------------------------------------------------------------- Fetal Evaluation  Num Of Fetuses:         1  Fetal Heart Rate(bpm):  171   Cardiac Activity:       Observed  Presentation:           Breech  Placenta:               Posterior  P. Cord Insertion:      Visualized  Amniotic Fluid  AFI FV:      Within normal limits                              Largest Pocket(cm)                              3 ---------------------------------------------------------------------- OB History  Gravidity:    3         Term:   1        Prem:   0        SAB:   1  TOP:          0       Ectopic:  0        Living: 0 ---------------------------------------------------------------------- Gestational Age  Best:          19w 1d     Det. By:  Marcella Dubs         EDD:   06/13/18 ---------------------------------------------------------------------- Cervix Uterus Adnexa  Cervix  Appears dilated, see comments. Cerclage visualized. ---------------------------------------------------------------------- Impression  Ms. Knupp, G3 P0 at 19w 1d gestation, is being evaluated in  the MAU. She was sent over from your office with dilated  cervix. Patient reports mild uterine cramping, but no  vaginal  bleeding.  She had prophylactic cerclage on 12/15/17 at [facility]). Subsequently, she [facility] 10 days ago (01/08/18). The cerclage was  placed proximal to the first stitch.  On transvaginal ultrasound performed on 01/14/18, the cervix  measured 5 millimeters with funneling.  Gyn history: History of LEEP.  Ob history: 1) Early pregnancy loss (4 weeks) in [place]. 2) 17-  week pregnancy loss in May 2018 (cervical incompetence).  On ultrasound, amniotic fluid is normal and good fetal activity  is seen. Footling presentation. The cervical canal is  completely dilated on transabdominal scan.  I explained the findings and recommended sterile-speculum  and vaginal examination.  On speculum exam, the cervix was seen to be dilated and the  membrane was visible. On vaginal examination, the cervix  was about 4 cm dilated with membrane projecting into  the  vagina. Cerclage is felt and I did not attempt a detailed  examination to check the intergrity of stitches.  I counseled the couple on the findings. The likelihood of  spontaneous miscarriage is very high. Given that she has  uterine cramping, I recommended removal of cerclages (to  prevent cervical tear).  Discussed with Dr. Henderson Cloud. ---------------------------------------------------------------------- Recommendations  -Cerclage to be removed (2 stitches) ----------------------------------------------------------------------                  Noralee Space, MD Electronically Signed Final Report   01/18/2018 02:59 pm ----------------------------------------------------------------------  Crist Fat +  Patient seen and examined by Dr Noralee Space, MFM whose note is pending. He D/W patient recommendation to remove cerclage. I D/W Dr Judeth Cornfield on phone his recommendation to remove cerclage and then expectant management.  I reviewed with patient above and recommendation to remove cerclage. Reviewed that fetus may deliver soon or possibly be delayed. D/W previable status and that no resuscitation is indicated. She stated she understood and agrees. Also D/W possible retained POC and need for D&C. Patient agrees with plan to remove cerclage.  Procedure>patient positioned in dorsal lithotomy in MAU. Cerclage #1 (Novafil?) removed without difficulty. Cerclage #2 (Mersilene) is transected but will not pull completley out of cervix. That is left in place for now. Cx = 6cm with bulging membranes after above.    Assessment/Plan: A/P: cervical insufficiency with failed transvaginal cerclage x 2         PPROM         Will transfer to third floor for expectant management  Roselle Locus II 01/18/2018, 3:25 PM

## 2018-01-18 NOTE — Brief Op Note (Signed)
01/18/2018  9:31 PM  PATIENT:  Melody Zamora  27 y.o. female  PRE-OPERATIVE DIAGNOSIS:  Retained products of conception  POST-OPERATIVE DIAGNOSIS:  Retained products of conception  PROCEDURE:  Procedure(s): DILATATION AND EVACUATION (N/A)  SURGEON:  Surgeon(s) and Role:    * Harold Hedgeomblin, Ebone Alcivar, MD - Primary  PHYSICIAN ASSISTANT:   ASSISTANTS: none   ANESTHESIA:   general  EBL:  400 mL   BLOOD ADMINISTERED:none  DRAINS: Urinary Catheter (Foley)   LOCAL MEDICATIONS USED:  NONE  SPECIMEN:  Source of Specimen:  placenta and endometrial currettings  DISPOSITION OF SPECIMEN:  PATHOLOGY  COUNTS:  YES  TOURNIQUET:  * No tourniquets in log *  DICTATION: .Other Dictation: Dictation Number [id number]  PLAN OF CARE: Admit for overnight observation  PATIENT DISPOSITION:  PACU - hemodynamically stable.   Delay start of Pharmacological VTE agent (>24hrs) due to surgical blood loss or risk of bleeding: not applicable

## 2018-01-18 NOTE — Progress Notes (Signed)
Chaplain called to provide spiritual and emotional support. When Chaplain arrived FOB and nursing staff were at bedside. Chaplain briefly spoke to Pt. Pt and FOB reported to be "fine" --Both mother and FOB used few words to describe how they were doing. Emotional support offered but was limited due the chaplain arriving right before Pt's procedure.  Chaplain plans to visit after the procedure-when things have had time to calm down.

## 2018-01-18 NOTE — Progress Notes (Signed)
SVD of nonviable fetus in bed I arrived and husband holding fetus About 200cc clot in bed The cord is clamped and placenta undelivered With gentle traction the placenta fails to delivery and some increased bleeding>total of about 400cc in bed Patient bolused IV fluid and pitocin D/W patient D&E and risks including infection, uterine perforation and organ damage, bleeding/transfusion-HIV/Hep, DVT/PE, pneumonia, IU synechiea and secondary infertility. Patient states she understands and agrees.

## 2018-01-18 NOTE — Progress Notes (Signed)
Patient delivered vaginal spontaneous living female at [id number]. Cord clamped with two clamps and cut. Mother handed baby. Baby had spontaneous respirations and heartbeat. Heartbeat auscultated by RN who heard 5 beats over a minute. Dr Vergie LivingPickens in to see patient immediately following delivery. Dr Henderson Cloudomblin in and pitocin started at 2000.

## 2018-01-18 NOTE — Transfer of Care (Signed)
Immediate Anesthesia Transfer of Care Note  Patient: Melody Zamora  Procedure(s) Performed: DILATATION AND EVACUATION (N/A )  Patient Location: PACU  Anesthesia Type:General  Level of Consciousness: awake, alert  and oriented  Airway & Oxygen Therapy: Patient Spontanous Breathing and Patient connected to nasal cannula oxygen  Post-op Assessment: Report given to RN and Post -op Vital signs reviewed and stable  Post vital signs: Reviewed and stable HR 112, RR 18, SaO2 100%, BP 119/79  Last Vitals:  Vitals Value Taken Time  BP 119/79 01/18/2018 10:00 PM  Temp    Pulse    Resp 32 01/18/2018 10:01 PM  SpO2    Vitals shown include unvalidated device data.  Last Pain:  Vitals:   01/18/18 2155  TempSrc: (P) Oral  PainSc:          Complications: No apparent anesthesia complications

## 2018-01-18 NOTE — MAU Note (Signed)
Having contractions, started yesterday. No bleeding. Watery d/c- started this morning.  19wks preg with cerclage

## 2018-01-18 NOTE — MAU Provider Note (Signed)
Chief Complaint: Contractions and Vaginal Discharge   First Provider Initiated Contact with Patient 01/18/18 1325     SUBJECTIVE HPI: Melody Zamora is a 27 y.o. G3P0010 at 7675w1d who presents to Maternity Admissions reporting contractions & LOF. History of second trimester loss & currently has cerclage in place.  Reports lower abdominal cramping today. Dr. Elon SpannerLeger checked cervix in office & she is dilated 5 cm. Patient reports that since leaving the office, she feels like she's been leaking fluid.  Denies n/v/d, dysuria, vaginal bleeding, fever/chills.   Location: lower abdomen Quality: cramping Severity: 5/10 on pain scale Duration: 1 day Timing: intermittent Modifying factors: none Associated signs and symptoms: leaking fluid  Past Medical History:  Diagnosis Date  . Vaginal Pap smear, abnormal    OB History  Gravida Para Term Preterm AB Living  3 1     1  0  SAB TAB Ectopic Multiple Live Births  1       1    # Outcome Date GA Lbr Len/2nd Weight Sex Delivery Anes PTL Lv  3 Current           2 Para 05/27/16 3519w3d   M Vag-Spont None  ND     Complications: Preterm premature rupture of membranes (PPROM) delivered, current hospitalization  1 SAB            Past Surgical History:  Procedure Laterality Date  . CERVICAL CERCLAGE N/A 12/15/2017   Procedure: CERCLAGE CERVICAL;  Surgeon: Ranae PilaLeger, Elise Jennifer, MD;  [facility];  Service: Gynecology;  Laterality: N/A;  spoke w/ Lisa-OK at 8:30am, BS Virtual Rm 2  . LEEP     Social History   Socioeconomic History  . Marital status: Single    Spouse name: Not on file  . Number of children: Not on file  . Years of education: Not on file  . Highest education level: Not on file  Occupational History  . Not on file  Social Needs  . Financial resource strain: Not hard at all  . Food insecurity:    Worry: Never true    Inability: Never true  . Transportation needs:    Medical: No    Non-medical: Not on file   Tobacco Use  . Smoking status: Never Smoker  . Smokeless tobacco: Never Used  Substance and Sexual Activity  . Alcohol use: No  . Drug use: No  . Sexual activity: Not Currently  Lifestyle  . Physical activity:    Days per week: Not on file    Minutes per session: Not on file  . Stress: Only a little  Relationships  . Social connections:    Talks on phone: Not on file    Gets together: Not on file    Attends religious service: Not on file    Active member of club or organization: Not on file    Attends meetings of clubs or organizations: Not on file    Relationship status: Not on file  . Intimate partner violence:    Fear of current or ex partner: No    Emotionally abused: No    Physically abused: No    Forced sexual activity: No  Other Topics Concern  . Not on file  Social History Narrative  . Not on file   Family History  Problem Relation Age of Onset  . Hypertension Mother    No current facility-administered medications on file prior to encounter.    Current Outpatient Medications on File Prior to Encounter  Medication Sig Dispense Refill  . Doxylamine-Pyridoxine ER (BONJESTA) 20-20 MG TBCR Take 1 tablet by mouth daily as needed (N/V due to pregnancy).    . Prenatal Vit-Fe Fumarate-FA (MULTIVITAMIN-PRENATAL) 27-0.8 MG TABS tablet Take 1 tablet by mouth daily at 12 noon.     No Known Allergies  I have reviewed patient's Past Medical Hx, Surgical Hx, Family Hx, Social Hx, medications and allergies.   Review of Systems  Constitutional: Negative.   Gastrointestinal: Positive for abdominal pain.  Genitourinary: Positive for vaginal discharge. Negative for dysuria and vaginal bleeding.    OBJECTIVE Patient Vitals for the past 24 hrs:  BP Temp Temp src Pulse Resp SpO2 Weight  01/18/18 1406 (!) 113/50 98.6 F (37 C) - (!) 108 18 - -  01/18/18 1304 (!) 147/69 99 F (37.2 C) Oral (!) 125 20 100 % 127.9 kg   Constitutional: Well-developed, well-nourished female in  no acute distress.  Cardiovascular: normal rate & rhythm, no murmur Respiratory: normal rate and effort. Lung sounds clear throughout GI: Abd soft, non-tender, Pos BS x 4. No guarding or rebound tenderness MS: Extremities nontender, no edema, normal ROM Neurologic: Alert and oriented x 4.  GU:     SPECULUM EXAM: NEFG, small amount of pooling, difficult to visualize all of cervix d/t vaginal walls obstructing view.   BIMANUAL: deferred  LAB RESULTS Results for orders placed or performed during the hospital encounter of 01/18/18 (from the past 24 hour(s))  CBC     Status: Abnormal   Collection Time: 01/18/18  1:44 PM  Result Value Ref Range   WBC 14.7 (H) 4.0 - 10.5 K/uL   RBC 3.92 3.87 - 5.11 MIL/uL   Hemoglobin 12.3 12.0 - 15.0 g/dL   HCT 16.1 09.6 - 04.5 %   MCV 92.1 80.0 - 100.0 fL   MCH 31.4 26.0 - 34.0 pg   MCHC 34.1 30.0 - 36.0 g/dL   RDW 40.9 81.1 - 91.4 %   Platelets 271 150 - 400 K/uL   nRBC 0.0 0.0 - 0.2 %  Comprehensive metabolic panel     Status: Abnormal   Collection Time: 01/18/18  1:44 PM  Result Value Ref Range   Sodium 133 (L) 135 - 145 mmol/L   Potassium 4.0 3.5 - 5.1 mmol/L   Chloride 105 98 - 111 mmol/L   CO2 19 (L) 22 - 32 mmol/L   Glucose, Bld 103 (H) 70 - 99 mg/dL   BUN 6 6 - 20 mg/dL   Creatinine, Ser 7.82 0.44 - 1.00 mg/dL   Calcium 8.9 8.9 - 95.6 mg/dL   Total Protein 6.8 6.5 - 8.1 g/dL   Albumin 3.4 (L) 3.5 - 5.0 g/dL   AST 24 15 - 41 U/L   ALT 32 0 - 44 U/L   Alkaline Phosphatase 63 38 - 126 U/L   Total Bilirubin 0.7 0.3 - 1.2 mg/dL   GFR calc non Af Amer >60 >60 mL/min   GFR calc Af Amer >60 >60 mL/min   Anion gap 9 5 - 15  POCT fern test     Status: None   Collection Time: 01/18/18  1:53 PM  Result Value Ref Range   POCT Fern Test Positive = ruptured amniotic membanes   Wet prep, genital     Status: Abnormal   Collection Time: 01/18/18  1:55 PM  Result Value Ref Range   Yeast Wet Prep HPF POC NONE SEEN NONE SEEN   Trich, Wet Prep NONE  SEEN NONE  SEEN   Clue Cells Wet Prep HPF POC NONE SEEN NONE SEEN   WBC, Wet Prep HPF POC MODERATE (A) NONE SEEN   Sperm NONE SEEN     IMAGING [place] Mfm Ob Limited  Result Date: 01/18/2018 ----------------------------------------------------------------------  OBSTETRICS REPORT                       (Signed Final 01/18/2018 02:59 pm) ---------------------------------------------------------------------- Patient Info  ID #:       [id number]                          D.O.B.:  Dec 25, 1990 (27 yrs)  Name:       Melody Zamora                  Visit Date: 01/18/2018 02:42 pm ---------------------------------------------------------------------- Performed By  Performed By:     Marcellina MillinKelly L Moser          Ref. Address:     [address] [place]                    [address]                                                             [address]                                                             [place]                                                             [id number]  Attending:        Noralee Spaceavi Shankar MD        Secondary Phy.:   MAU Nursing-                                                             MAU/Triage  Referred By:      Harold HedgeJAMES TOMBLIN          [facility]                    MD ---------------------------------------------------------------------- Orders   #  Description                          Code         Ordered By   1  US MFM OB LIMITED                    [id number]     Judeth HornERIN Umi Mainor  ----------------------------------------------------------------------   #  Order #                    Accession #                 Episode #   1  [id number]                  [id number]                  [id number]  ---------------------------------------------------------------------- Indications   Cervical cerclage suture present, second       O34.32   trimester   [redacted] weeks gestation of pregnancy                Z3A.19   Vaginal discharge during pregnancy in          O26.892   second trimester   Poor obstetric history:  Previous midtrimester  O09.299   loss  ---------------------------------------------------------------------- Fetal Evaluation  Num Of Fetuses:         1  Fetal Heart Rate(bpm):  171  Cardiac Activity:       Observed  Presentation:           Breech  Placenta:               Posterior  P. Cord Insertion:      Visualized  Amniotic Fluid  AFI FV:      Within normal limits                              Largest Pocket(cm)                              3 ---------------------------------------------------------------------- OB History  Gravidity:    3         Term:   1        Prem:   0        SAB:   1  TOP:          0       Ectopic:  0        Living: 0 ---------------------------------------------------------------------- Gestational Age  Best:          19w 1d     Det. By:  Marcella Dubs         EDD:   06/13/18 ---------------------------------------------------------------------- Cervix Uterus Adnexa  Cervix  Appears dilated, see comments. Cerclage visualized. ---------------------------------------------------------------------- Impression  Ms. Cordoba, G3 P0 at 19w 1d gestation, is being evaluated in  the MAU. She was sent over from your office with dilated  cervix. Patient reports mild uterine cramping, but no vaginal  bleeding.  She had prophylactic cerclage on 12/15/17 at [facility]). Subsequently, she had revision cerclage at  [facility] 10 days ago (01/08/18). The cerclage was  placed proximal to the first stitch.  On transvaginal ultrasound performed on 01/14/18, the cervix  measured 5 millimeters with funneling.  Gyn history: History of LEEP.  Ob history: 1) Early pregnancy loss (4 weeks) in [place]. 2) 17-  week pregnancy loss in May 2018 (cervical incompetence).  On ultrasound, amniotic fluid is normal and good fetal activity  is seen. Footling presentation. The cervical canal is  completely dilated on transabdominal scan.  I explained the findings and recommended sterile-speculum  and vaginal  examination.  On speculum exam, the cervix was seen to be dilated and the  membrane was visible. On vaginal examination, the cervix  was about 4 cm dilated with membrane projecting into the  vagina. Cerclage is felt and I did not attempt a detailed  examination to check the intergrity of stitches.  I counseled the couple on the findings. The likelihood of  spontaneous miscarriage is very high. Given that she has  uterine cramping, I recommended removal of cerclages (to  prevent cervical tear).  Discussed with Dr. Henderson Cloud. ---------------------------------------------------------------------- Recommendations  -Cerclage to be removed (2 stitches) ----------------------------------------------------------------------                  Noralee Space, MD Electronically Signed Final Report   01/18/2018 02:59 pm ----------------------------------------------------------------------   MAU COURSE Orders Placed This Encounter  Procedures  . Wet prep, genital  . [place] MFM OB LIMITED  . CBC  . Comprehensive metabolic panel  . POCT fern test   Meds ordered this encounter  Medications  . FOLLOWED BY Linked Order Group   . lactated ringers bolus 1,000 mL   . lactated ringers infusion  . HYDROmorphone (DILAUDID) injection 0.5 mg    MDM FHR present via doppler  SSE perform, pooling & fern positive OB ultrasound ordered & exam will be performed by Dr. Judeth Cornfield (MFM).  Dr. Henderson Cloud on unit to discuss results & plan of care  ASSESSMENT 1. [redacted] weeks gestation of pregnancy   2. Vaginal discharge during pregnancy in second trimester   3. Cervical insufficiency during pregnancy in second trimester, antepartum     PLAN Dr. Henderson Cloud on unit to discuss plan with patient.    Judeth Horn, NP 01/18/2018  3:07 PM

## 2018-01-18 NOTE — Op Note (Signed)
NAME: Melody Zamora, [name] MEDICAL RECORD ZO:[id number] ACCOUNT [id number] DATE OF BIRTH:1990-07-25 FACILITY: [facility] LOCATION: [facility] PHYSICIAN:Evynn Boutelle Jamal CollinE. Quinten Allerton II, MD  OPERATIVE REPORT  DATE OF PROCEDURE:  01/18/2018  PREOPERATIVE DIAGNOSIS:  Retained products of conception.  POSTOPERATIVE DIAGNOSIS:  Retained products of conception.  PROCEDURE:  Dilation and evacuation.  SURGEON:  Harold HedgeJames Ophia Shamoon II, MD  ANESTHESIA:  General with endotracheal intubation, Dr. Sampson GoonFitzgerald.  ESTIMATED BLOOD LOSS:  1000 mL.  SPECIMENS:  Placenta and endometrial curettings to pathology.  INDICATIONS AND CONSENT:  This patient is a 27 year old G3 P0 at 19-1/7 weeks who was admitted with failed cerclage x2, advanced cervical dilation, and preterm premature rupture of membranes.  After consultation with maternal fetal medicine, the sutures  were cut.  The patient was then transferred to the floor for expectant management where she delivered the fetus several hours later.  The placenta was retained, and she has about approximately 400-500 mL of blood loss in the bed.  Recommendation for  dilation and evacuation was made.  Potential risks and complications were reviewed with the patient preoperatively, including but not limited to infection, uterine perforation, organ damage, bleeding requiring transfusion of blood products with HIV and  hepatitis acquisition, DVT, PE, pneumonia, and intrauterine synechiae with secondary infertility.  The patient states she understands and agrees, and consent was signed on the chart.  DESCRIPTION OF PROCEDURE:  The patient was taken to the operating room where she was placed in the dorsal supine position, and general anesthesia was induced via endotracheal intubation.  She was placed in the dorsal lithotomy position.  Timeout was  undertaken.  She was prepped with Betadine, bladder straight catheterized, and she was draped in sterile fashion.  Bimanual examination revealed the  placenta to be at the level of the cervix, and it was removed intact without difficulty.  The patient had  continued bleeding.  Pitocin was added to her IV, and vigorous massage was undertaken.  She was also given intramuscular Methergine.  Gentle sharp curettage was carried out, followed by suction curettage with a #10 suction curette.  Continued bleeding  was noted, and careful examination revealed some bleeding at 2 points on the cervix secondary to the cerclage that was removed.  These were controlled with ring clamps and were ligated with 0 Vicryl in a through-and-through stitch on the cervix.  The  patient was then given 600 mcg of Cytotec per rectum.  Observation was carried out, and good hemostasis was noted.  Therefore, instruments were removed.  All counts were correct.  The patient was awakened and taken to recovery room in stable condition.  LN/NUANCE  D:01/18/2018 T:01/18/2018 JOB:[id number]

## 2018-01-18 NOTE — MAU Note (Signed)
Dr. Henderson Cloudomblin in to remove cerclage.  Removed, pt tolerated well.

## 2018-01-18 NOTE — Anesthesia Preprocedure Evaluation (Signed)
Anesthesia Evaluation  Patient identified by MRN, date of birth, ID band Patient awake    Reviewed: Allergy & Precautions, NPO status , Patient's Chart, lab work & pertinent test resultsPreop documentation limited or incomplete due to emergent nature of procedure.  Airway Mallampati: III  TM Distance: >3 FB Neck ROM: Full    Dental  (+) Dental Advisory Given   Pulmonary neg pulmonary ROS,    breath sounds clear to auscultation       Cardiovascular negative cardio ROS   Rhythm:Regular Rate:Normal     Neuro/Psych negative neurological ROS     GI/Hepatic negative GI ROS, Neg liver ROS,   Endo/Other  negative endocrine ROS  Renal/GU negative Renal ROS     Musculoskeletal   Abdominal   Peds  Hematology  (+) anemia ,   Anesthesia Other Findings   Reproductive/Obstetrics S/p delivery IUFD with retained products and bleeding.                             Lab Results  Component Value Date   WBC 14.7 (H) 01/18/2018   HGB 12.3 01/18/2018   HCT 36.1 01/18/2018   MCV 92.1 01/18/2018   PLT 271 01/18/2018   Lab Results  Component Value Date   CREATININE 0.62 01/18/2018   BUN 6 01/18/2018   NA 133 (L) 01/18/2018   K 4.0 01/18/2018   CL 105 01/18/2018   CO2 19 (L) 01/18/2018    Anesthesia Physical Anesthesia Plan  ASA: III and emergent  Anesthesia Plan: General   Post-op Pain Management:    Induction: Intravenous and Rapid sequence  PONV Risk Score and Plan: 3 and Ondansetron, Dexamethasone and Treatment may vary due to age or medical condition  Airway Management Planned: Oral ETT and Video Laryngoscope Planned  Additional Equipment:   Intra-op Plan:   Post-operative Plan: Extubation in OR  Informed Consent: I have reviewed the patients History and Physical, chart, labs and discussed the procedure including the risks, benefits and alternatives for the proposed anesthesia with  the patient or authorized representative who has indicated his/her understanding and acceptance.   Dental advisory given  Plan Discussed with: CRNA  Anesthesia Plan Comments:         Anesthesia Quick Evaluation

## 2018-01-18 NOTE — Anesthesia Procedure Notes (Signed)
Procedure Name: Intubation Date/Time: 01/18/2018 8:38 PM Performed by: Elbert Ewingshymer, Juneau Doughman S, CRNA Pre-anesthesia Checklist: Patient identified, Emergency Drugs available, Suction available, Patient being monitored and Timeout performed Patient Re-evaluated:Patient Re-evaluated prior to induction Oxygen Delivery Method: Circle system utilized Preoxygenation: Pre-oxygenation with 100% oxygen Induction Type: IV induction, Cricoid Pressure applied and Rapid sequence Laryngoscope Size: Glidescope and 3 Grade View: Grade I Tube type: Oral Tube size: 7.0 mm Number of attempts: 1 Airway Equipment and Method: Stylet and Video-laryngoscopy Placement Confirmation: ETT inserted through vocal cords under direct vision,  positive ETCO2 and breath sounds checked- equal and bilateral Secured at: 22 cm Tube secured with: Tape Dental Injury: Teeth and Oropharynx as per pre-operative assessment

## 2018-01-19 ENCOUNTER — Encounter (HOSPITAL_COMMUNITY): Payer: Self-pay

## 2018-01-19 LAB — CBC
HCT: 27.5 % — ABNORMAL LOW (ref 36.0–46.0)
Hemoglobin: 9.3 g/dL — ABNORMAL LOW (ref 12.0–15.0)
MCH: 31 pg (ref 26.0–34.0)
MCHC: 33.8 g/dL (ref 30.0–36.0)
MCV: 91.7 fL (ref 80.0–100.0)
Platelets: 237 10*3/uL (ref 150–400)
RBC: 3 MIL/uL — ABNORMAL LOW (ref 3.87–5.11)
RDW: 14.2 % (ref 11.5–15.5)
WBC: 19.9 10*3/uL — ABNORMAL HIGH (ref 4.0–10.5)
nRBC: 0 % (ref 0.0–0.2)

## 2018-01-19 MED ORDER — OXYCODONE HCL 5 MG PO TABS: 5.0000 mg | ORAL_TABLET | ORAL | Status: DC | PRN

## 2018-01-19 MED ORDER — IBUPROFEN 600 MG PO TABS
600.0000 mg | ORAL_TABLET | Freq: Four times a day (QID) | ORAL | Status: DC | PRN
  Administered 2018-01-19: 600 mg via ORAL
  Filled 2018-01-19: qty 1

## 2018-01-19 MED ORDER — ONDANSETRON HCL 4 MG/2ML IJ SOLN: 4.0000 mg | Freq: Four times a day (QID) | INTRAMUSCULAR | Status: DC | PRN

## 2018-01-19 MED ORDER — IBUPROFEN 600 MG PO TABS: 600.0000 mg | ORAL_TABLET | Freq: Four times a day (QID) | ORAL | 0 refills | Status: DC | PRN

## 2018-01-19 MED ORDER — ALUM & MAG HYDROXIDE-SIMETH 200-200-20 MG/5ML PO SUSP: 30.0000 mL | ORAL | Status: DC | PRN

## 2018-01-19 MED ORDER — MENTHOL 3 MG MT LOZG: 1.0000 | LOZENGE | OROMUCOSAL | Status: DC | PRN

## 2018-01-19 MED ORDER — LACTATED RINGERS IV SOLN
INTRAVENOUS | Status: DC
  Administered 2018-01-19 (×2): via INTRAVENOUS

## 2018-01-19 MED ORDER — ACETAMINOPHEN 500 MG PO TABS
1000.0000 mg | ORAL_TABLET | Freq: Four times a day (QID) | ORAL | Status: DC
  Administered 2018-01-19 (×2): 1000 mg via ORAL
  Filled 2018-01-19 (×2): qty 2

## 2018-01-19 MED ORDER — ONDANSETRON HCL 4 MG PO TABS: 4.0000 mg | ORAL_TABLET | Freq: Four times a day (QID) | ORAL | Status: DC | PRN

## 2018-01-19 MED ORDER — ACETAMINOPHEN 325 MG PO TABS: 650.0000 mg | ORAL_TABLET | Freq: Four times a day (QID) | ORAL | 1 refills | Status: DC | PRN

## 2018-01-19 NOTE — Progress Notes (Signed)
I offered support to pt after the loss of her daughter, Georgiann Mohsamasa.  She lost a baby previously in 2018 at 17 weeks. She was tearful and processing the fact that she has lost two children.  She has support from her sister and her SO as well as from family in [place].  [facility] just as she did with her other child.    She is aware that we are here for ongoing support.  Chaplain Dyanne CarrelKaty Rhya Shan, Bcc Pager, [phone] 12:33 PM    01/19/18 1200  Clinical Encounter Type  Visited With Patient and family together  Visit Type Spiritual support  Spiritual Encounters  Spiritual Needs Emotional;Grief support  Stress Factors  Patient Stress Factors Loss

## 2018-01-19 NOTE — Anesthesia Postprocedure Evaluation (Signed)
Anesthesia Post Note  Patient: Melody Zamora  Procedure(s) Performed: DILATATION AND EVACUATION (N/A )     Patient location during evaluation: PACU Anesthesia Type: General Level of consciousness: awake and alert Pain management: pain level controlled Vital Signs Assessment: post-procedure vital signs reviewed and stable Respiratory status: spontaneous breathing, nonlabored ventilation, respiratory function stable and patient connected to nasal cannula oxygen Cardiovascular status: blood pressure returned to baseline and stable Postop Assessment: no apparent nausea or vomiting Anesthetic complications: no    Last Vitals:  Vitals:   01/19/18 0101 01/19/18 0359  BP: 119/71 120/69  Pulse: 98 (!) 105  Resp: 17 20  Temp: 37.1 C 36.8 C  SpO2: 100%     Last Pain:  Vitals:   01/19/18 0359  TempSrc: Oral  PainSc:    Pain Goal:                 Kennieth RadFitzgerald, Genell Thede E

## 2018-01-19 NOTE — Anesthesia Postprocedure Evaluation (Signed)
Anesthesia Post Note  Patient: Chennel Masley  Procedure(s) Performed: DILATATION AND EVACUATION (N/A )     Patient location during evaluation: [facility] Unit Anesthesia Type: General Level of consciousness: awake and alert Pain management: pain level controlled Vital Signs Assessment: post-procedure vital signs reviewed and stable Respiratory status: spontaneous breathing Cardiovascular status: stable Postop Assessment: adequate PO intake, able to ambulate and no apparent nausea or vomiting Anesthetic complications: no    Last Vitals:  Vitals:   01/19/18 0101 01/19/18 0359  BP: 119/71 120/69  Pulse: 98 (!) 105  Resp: 17 20  Temp: 37.1 C 36.8 C  SpO2: 100%     Last Pain:  Vitals:   01/19/18 0535  TempSrc:   PainSc: 0-No pain   Pain Goal:                 Salome ArntSterling, Amunique Neyra Marie

## 2018-01-19 NOTE — Lactation Note (Signed)
Lactation Consultation Note  Patient Name: Melody Zamora BMWUX'LToday's Date: 01/19/2018   Lactation Consult due to fetal loss.  Discussed with patinet possibility of her milk coming in.  Reviewed suggestions to increase comfort past fetal loss.  Gave and reviewed handouts.  Mom reports she has never heard of WIC.  Reviewed WIC info after loss and brochure given to patient. Urged mom to follow up with lactation Services as needed.  Maternal Data    Feeding    LATCH Score                   Interventions    Lactation Tools Discussed/Used     Consult Status      Melody Zamora 01/19/2018, 11:40 AM

## 2018-01-19 NOTE — Progress Notes (Signed)
POD #1 Ambulating without difficulty, voiding, tolerating regular diet Scant bleeding  Vitals:   01/19/18 0101 01/19/18 0359  BP: 119/71 120/69  Pulse: 98 (!) 105  Resp: 17 20  Temp: 98.7 F (37.1 C) 98.3 F (36.8 C)  SpO2: 100%    Abdomen FFNT  CBC pending  A/P: cervical insufficiency         Failed cerclage         D/W patient course of events and surgery last pm         Instructions reviewed, FU office 2-4 weeks

## 2018-01-20 LAB — GC/CHLAMYDIA PROBE AMP (~~LOC~~) NOT AT ARMC
Chlamydia: NEGATIVE
NEISSERIA GONORRHEA: NEGATIVE

## 2018-01-24 NOTE — Discharge Summary (Signed)
Physician Discharge Summary  Patient ID: Melody Zamora MRN: [id number] DOB/AGE: 05-05-1990 27 y.o.  Admit date: 01/18/2018 Discharge date: 01/24/2018  Admission Diagnoses:Cervical insufficiency  Discharge Diagnoses:  Active Problems:   Cervical insufficiency during pregnancy in second trimester, antepartum   Discharged Condition: good  Hospital Course: Admitted with cervical dilation and bulging membranes. After MFM consultation the two cerclage sutures are cut. Patient delivered several hours later. Retained placenta with about 400cc of blood loss in bed led to transfer to OR for D&E. Patient recovered uneventfully and was discharged home the next day.  Consults: MFM  Significant Diagnostic Studies: labs:  Recent Results (from the past 2160 hour(s))  OB RESULTS CONSOLE RPR     Status: None   Collection Time: 11/02/17 12:00 AM  Result Value Ref Range   RPR Nonreactive   OB RESULTS CONSOLE HIV antibody     Status: None   Collection Time: 11/02/17 12:00 AM  Result Value Ref Range   HIV Non-reactive   OB RESULTS CONSOLE Rubella Antibody     Status: None   Collection Time: 11/02/17 12:00 AM  Result Value Ref Range   Rubella Immune   OB RESULTS CONSOLE Hepatitis B surface antigen     Status: None   Collection Time: 11/02/17 12:00 AM  Result Value Ref Range   Hepatitis B Surface Ag Negative   OB RESULTS CONSOLE ABO/Rh     Status: None   Collection Time: 11/02/17 12:00 AM  Result Value Ref Range   RH Type  Positive    ABO Grouping O   OB RESULTS CONSOLE Antibody Screen     Status: None   Collection Time: 11/02/17 12:00 AM  Result Value Ref Range   Antibody Screen n   Type and screen [facility]     Status: None   Collection Time: 12/15/17  6:52 AM  Result Value Ref Range   ABO/RH(D) O POS    Antibody Screen NEG    Sample Expiration      12/18/2017 Performed at [facility], [address], [place], [place] [address]   GC/Chlamydia probe amp  ([place])not at [facility]     Status: None   Collection Time: 01/18/18 12:00 AM  Result Value Ref Range   Chlamydia Negative     Comment: Normal Reference Range - Negative   Neisseria gonorrhea Negative     Comment: Normal Reference Range - Negative  Type and screen [facility]     Status: None   Collection Time: 01/18/18  1:35 PM  Result Value Ref Range   ABO/RH(D) O POS    Antibody Screen NEG    Sample Expiration      01/21/2018 Performed at [facility], [address], [place], [place] [address]   CBC     Status: Abnormal   Collection Time: 01/18/18  1:44 PM  Result Value Ref Range   WBC 14.7 (H) 4.0 - 10.5 K/uL   RBC 3.92 3.87 - 5.11 MIL/uL   Hemoglobin 12.3 12.0 - 15.0 g/dL   HCT 82.9 56.2 - 13.0 %   MCV 92.1 80.0 - 100.0 fL   MCH 31.4 26.0 - 34.0 pg   MCHC 34.1 30.0 - 36.0 g/dL   RDW 86.5 78.4 - 69.6 %   Platelets 271 150 - 400 K/uL   nRBC 0.0 0.0 - 0.2 %    Comment: Performed at [facility], [address], [place], [place] [address]  Comprehensive metabolic panel  Status: Abnormal   Collection Time: 01/18/18  1:44 PM  Result Value Ref Range   Sodium 133 (L) 135 - 145 mmol/L   Potassium 4.0 3.5 - 5.1 mmol/L   Chloride 105 98 - 111 mmol/L   CO2 19 (L) 22 - 32 mmol/L   Glucose, Bld 103 (H) 70 - 99 mg/dL   BUN 6 6 - 20 mg/dL   Creatinine, Ser 4.090.62 0.44 - 1.00 mg/dL   Calcium 8.9 8.9 - 81.110.3 mg/dL   Total Protein 6.8 6.5 - 8.1 g/dL   Albumin 3.4 (L) 3.5 - 5.0 g/dL   AST 24 15 - 41 U/L   ALT 32 0 - 44 U/L   Alkaline Phosphatase 63 38 - 126 U/L   Total Bilirubin 0.7 0.3 - 1.2 mg/dL   GFR calc non Af Amer >60 >60 mL/min   GFR calc Af Amer >60 >60 mL/min   Anion gap 9 5 - 15    Comment: Performed at [facility], [address] [place], [place] [address]  POCT fern test     Status: None   Collection Time: 01/18/18  1:53 PM  Result Value Ref Range   POCT Fern Test Positive = ruptured amniotic membanes   Wet prep, genital      Status: Abnormal   Collection Time: 01/18/18  1:55 PM  Result Value Ref Range   Yeast Wet Prep HPF POC NONE SEEN NONE SEEN   Trich, Wet Prep NONE SEEN NONE SEEN   Clue Cells Wet Prep HPF POC NONE SEEN NONE SEEN   WBC, Wet Prep HPF POC MODERATE (A) NONE SEEN    Comment: MANY BACTERIA SEEN   Sperm NONE SEEN     Comment: Performed at [facility], [address] [place], [place] [address]  CBC     Status: Abnormal   Collection Time: 01/19/18  7:27 AM  Result Value Ref Range   WBC 19.9 (H) 4.0 - 10.5 K/uL   RBC 3.00 (L) 3.87 - 5.11 MIL/uL   Hemoglobin 9.3 (L) 12.0 - 15.0 g/dL    Comment: REPEATED TO VERIFY DELTA CHECK NOTED    HCT 27.5 (L) 36.0 - 46.0 %   MCV 91.7 80.0 - 100.0 fL   MCH 31.0 26.0 - 34.0 pg   MCHC 33.8 30.0 - 36.0 g/dL   RDW 13.014.2 86.511.5 - 78.415.5 %   Platelets 237 150 - 400 K/uL   nRBC 0.0 0.0 - 0.2 %    Comment: Performed at [facility], [address] [place], [place] [address]    Treatments: surgery: D&E  Discharge Exam: Blood pressure 111/67, pulse (!) 106, temperature 98.3 F (36.8 C), temperature source Oral, resp. rate 18, weight 127.9 kg, SpO2 100 %, unknown if currently breastfeeding. General appearance: alert, cooperative and no distress GI: soft, non-tender; bowel sounds normal; no masses,  no organomegaly  Disposition: FU in office 1-2 weeks   Allergies as of 01/19/2018   No Known Allergies     Medication List    STOP taking these medications   hydroxyprogesterone caproate 250 mg/mL Oil injection Commonly known as:  MAKENA   progesterone 100 MG capsule Commonly known as:  PROMETRIUM     TAKE these medications   acetaminophen 325 MG tablet Commonly known as:  TYLENOL Take 2 tablets (650 mg total) by mouth every 6 (six) hours as needed.   ibuprofen 600 MG tablet Commonly known as:  ADVIL,MOTRIN Take 1 tablet (600 mg total) by mouth every 6 (  six) hours as needed for cramping.   prenatal multivitamin Tabs tablet Take 1 tablet by  mouth daily at 12 noon.        Signed: Roselle LocusJames E Spirit Wernli II 01/24/2018, 6:11 PM

## 2018-02-17 ENCOUNTER — Encounter (HOSPITAL_COMMUNITY): Payer: Self-pay | Admitting: Obstetrics and Gynecology

## 2018-05-04 ENCOUNTER — Encounter (HOSPITAL_COMMUNITY): Admission: RE | Admit: 2018-05-04 | Payer: Medicaid Other | Source: Ambulatory Visit

## 2018-05-26 ENCOUNTER — Ambulatory Visit (HOSPITAL_BASED_OUTPATIENT_CLINIC_OR_DEPARTMENT_OTHER)
Admission: RE | Admit: 2018-05-26 | Payer: Medicaid Other | Source: Home / Self Care | Admitting: Obstetrics and Gynecology

## 2018-05-26 ENCOUNTER — Encounter (HOSPITAL_BASED_OUTPATIENT_CLINIC_OR_DEPARTMENT_OTHER): Admission: RE | Payer: Self-pay | Source: Home / Self Care

## 2018-05-26 SURGERY — CERCLAGE, CERVIX, ABDOMINAL APPROACH
Anesthesia: Choice

## 2018-07-01 ENCOUNTER — Other Ambulatory Visit: Payer: Self-pay

## 2018-07-01 ENCOUNTER — Encounter (HOSPITAL_BASED_OUTPATIENT_CLINIC_OR_DEPARTMENT_OTHER): Payer: Self-pay | Admitting: *Deleted

## 2018-07-01 NOTE — Progress Notes (Addendum)
Spoke with Melody Zamora after midnight, arrive 530am 07-07-18 [facility] meds to take:sertraline with sip of water Has surgery orders in epic Has lab appointment for cbc, type and screen anesthesia consult bmi 46.35 and covid test 07-03-09 200 pm Patient instructed overnight stay instructions including bring all prescription meds in original containers Need urine pregnancy day of surgery Driver fiance murana cell [phone]

## 2018-07-04 ENCOUNTER — Other Ambulatory Visit: Payer: Self-pay

## 2018-07-04 ENCOUNTER — Other Ambulatory Visit (HOSPITAL_COMMUNITY)
Admission: RE | Admit: 2018-07-04 | Discharge: 2018-07-04 | Disposition: A | Discharge status: Home / Self Care | Payer: No Typology Code available for payment source | Source: Ambulatory Visit | Attending: Obstetrics and Gynecology | Admitting: Obstetrics and Gynecology

## 2018-07-04 ENCOUNTER — Encounter (HOSPITAL_COMMUNITY)
Admission: RE | Admit: 2018-07-04 | Discharge: 2018-07-04 | Disposition: A | Discharge status: Home / Self Care | Payer: No Typology Code available for payment source | Source: Ambulatory Visit | Attending: Obstetrics and Gynecology | Admitting: Obstetrics and Gynecology

## 2018-07-04 DIAGNOSIS — Z1159 Encounter for screening for other viral diseases: Secondary | ICD-10-CM | POA: Insufficient documentation

## 2018-07-04 DIAGNOSIS — Z01812 Encounter for preprocedural laboratory examination: Secondary | ICD-10-CM | POA: Insufficient documentation

## 2018-07-04 LAB — ABO/RH: ABO/RH(D): O POS

## 2018-07-04 LAB — CBC
HCT: 41.3 % (ref 36.0–46.0)
Hemoglobin: 13.2 g/dL (ref 12.0–15.0)
MCH: 28.4 pg (ref 26.0–34.0)
MCHC: 32 g/dL (ref 30.0–36.0)
MCV: 89 fL (ref 80.0–100.0)
Platelets: 349 10*3/uL (ref 150–400)
RBC: 4.64 MIL/uL (ref 3.87–5.11)
RDW: 16.7 % — ABNORMAL HIGH (ref 11.5–15.5)
WBC: 7.4 10*3/uL (ref 4.0–10.5)
nRBC: 0 % (ref 0.0–0.2)

## 2018-07-06 LAB — NOVEL CORONAVIRUS, NAA (HOSP ORDER, SEND-OUT TO REF LAB; TAT 18-24 HRS): SARS-CoV-2, NAA: NOT DETECTED

## 2018-07-06 NOTE — Anesthesia Preprocedure Evaluation (Addendum)
Anesthesia Evaluation  Patient identified by MRN, date of birth, ID band Patient awake    Reviewed: Allergy & Precautions, NPO status , Patient's Chart, lab work & pertinent test results  History of Anesthesia Complications (+) PONVNegative for: history of anesthetic complications  Airway Mallampati: III  TM Distance: >3 FB Neck ROM: Full    Dental no notable dental hx. (+) Teeth Intact, Dental Advisory Given   Pulmonary neg pulmonary ROS,    Pulmonary exam normal breath sounds clear to auscultation       Cardiovascular negative cardio ROS Normal cardiovascular exam Rhythm:Regular Rate:Normal     Neuro/Psych negative neurological ROS  negative psych ROS   GI/Hepatic negative GI ROS, Neg liver ROS,   Endo/Other  negative endocrine ROSMorbid obesityBMI 46.7  Renal/GU negative Renal ROS  negative genitourinary   Musculoskeletal negative musculoskeletal ROS (+)   Abdominal   Peds negative pediatric ROS (+)  Hematology negative hematology ROS (+)   Anesthesia Other Findings Day of surgery medications reviewed with the patient.  Reproductive/Obstetrics negative OB ROS (+) Pregnancy                            Anesthesia Physical  Anesthesia Plan  ASA: III  Anesthesia Plan: Spinal   Post-op Pain Management:    Induction:   PONV Risk Score and Plan: 2 and Treatment may vary due to age or medical condition  Airway Management Planned: Natural Airway and Nasal Cannula  Additional Equipment:   Intra-op Plan:   Post-operative Plan:   Informed Consent: I have reviewed the patients History and Physical, chart, labs and discussed the procedure including the risks, benefits and alternatives for the proposed anesthesia with the patient or authorized representative who has indicated his/her understanding and acceptance.     Dental advisory given  Plan Discussed with: CRNA,  Anesthesiologist and Surgeon  Anesthesia Plan Comments:         Anesthesia Quick Evaluation

## 2018-07-06 NOTE — Progress Notes (Signed)
SPOKE W/  _     SCREENING SYMPTOMS OF COVID 19:   COUGH--no  RUNNY NOSE--- no  SORE THROAT---no  NASAL CONGESTION----no  SNEEZING----no  SHORTNESS OF BREATH---no  DIFFICULTY BREATHING---no  TEMP >100.0 -----no  UNEXPLAINED BODY ACHES------no  CHILLS -------- no  HEADACHES ---------no  LOSS OF SMELL/ TASTE --------no    HAVE YOU OR ANY FAMILY MEMBER TRAVELLED PAST 14 DAYS OUT OF THE   COUNTY---no STATE----no COUNTRY----no  HAVE YOU OR ANY FAMILY MEMBER BEEN EXPOSED TO ANYONE WITH COVID 19? no    

## 2018-07-07 ENCOUNTER — Encounter (HOSPITAL_BASED_OUTPATIENT_CLINIC_OR_DEPARTMENT_OTHER)
Admission: RE | Disposition: A | Discharge status: Home / Self Care | Payer: Self-pay | Source: Home / Self Care | Attending: Obstetrics and Gynecology

## 2018-07-07 ENCOUNTER — Inpatient Hospital Stay (HOSPITAL_BASED_OUTPATIENT_CLINIC_OR_DEPARTMENT_OTHER): Payer: No Typology Code available for payment source | Admitting: Certified Registered Nurse Anesthetist

## 2018-07-07 ENCOUNTER — Other Ambulatory Visit: Payer: Self-pay

## 2018-07-07 ENCOUNTER — Encounter (HOSPITAL_BASED_OUTPATIENT_CLINIC_OR_DEPARTMENT_OTHER): Payer: Self-pay | Admitting: Emergency Medicine

## 2018-07-07 ENCOUNTER — Observation Stay (HOSPITAL_BASED_OUTPATIENT_CLINIC_OR_DEPARTMENT_OTHER)
Admission: RE | Admit: 2018-07-07 | Discharge: 2018-07-08 | Disposition: A | Discharge status: Home / Self Care | Payer: No Typology Code available for payment source | Attending: Obstetrics and Gynecology | Admitting: Obstetrics and Gynecology

## 2018-07-07 DIAGNOSIS — Z79899 Other long term (current) drug therapy: Secondary | ICD-10-CM | POA: Diagnosis not present

## 2018-07-07 DIAGNOSIS — N883 Incompetence of cervix uteri: Secondary | ICD-10-CM | POA: Diagnosis present

## 2018-07-07 DIAGNOSIS — O3430 Maternal care for cervical incompetence, unspecified trimester: Secondary | ICD-10-CM | POA: Diagnosis not present

## 2018-07-07 HISTORY — DX: Other complications of anesthesia, initial encounter: T88.59XA

## 2018-07-07 HISTORY — DX: Other specified postprocedural states: Z98.890

## 2018-07-07 HISTORY — DX: Nausea with vomiting, unspecified: R11.2

## 2018-07-07 HISTORY — PX: ABDOMINAL CERCLAGE: SHX5384

## 2018-07-07 LAB — TYPE AND SCREEN
ABO/RH(D): O POS
Antibody Screen: NEGATIVE

## 2018-07-07 LAB — POCT PREGNANCY, URINE: Preg Test, Ur: NEGATIVE

## 2018-07-07 SURGERY — CERCLAGE, CERVIX, ABDOMINAL APPROACH
Anesthesia: Spinal | Site: Abdomen

## 2018-07-07 MED ORDER — IBUPROFEN 200 MG PO TABS
ORAL_TABLET | ORAL | Status: AC
  Filled 2018-07-07: qty 3

## 2018-07-07 MED ORDER — SUGAMMADEX SODIUM 200 MG/2ML IV SOLN
INTRAVENOUS | Status: AC
  Filled 2018-07-07: qty 2

## 2018-07-07 MED ORDER — PROPOFOL 10 MG/ML IV BOLUS
INTRAVENOUS | Status: AC
  Filled 2018-07-07: qty 20

## 2018-07-07 MED ORDER — MIDAZOLAM HCL 5 MG/5ML IJ SOLN
INTRAMUSCULAR | Status: DC | PRN
  Administered 2018-07-07 (×2): 1 mg via INTRAVENOUS

## 2018-07-07 MED ORDER — ONDANSETRON HCL 4 MG/2ML IJ SOLN
INTRAMUSCULAR | Status: DC | PRN
  Administered 2018-07-07: 4 mg via INTRAVENOUS

## 2018-07-07 MED ORDER — ONDANSETRON HCL 4 MG/2ML IJ SOLN
4.0000 mg | Freq: Once | INTRAMUSCULAR | Status: DC | PRN
  Filled 2018-07-07: qty 2

## 2018-07-07 MED ORDER — FENTANYL CITRATE (PF) 100 MCG/2ML IJ SOLN
25.0000 ug | INTRAMUSCULAR | Status: DC | PRN
  Filled 2018-07-07: qty 1

## 2018-07-07 MED ORDER — PHENYLEPHRINE HCL (PRESSORS) 10 MG/ML IV SOLN
INTRAVENOUS | Status: DC | PRN
  Administered 2018-07-07 (×4): 80 ug via INTRAVENOUS

## 2018-07-07 MED ORDER — OXYCODONE HCL 5 MG PO TABS
5.0000 mg | ORAL_TABLET | ORAL | Status: DC | PRN
  Administered 2018-07-07 – 2018-07-08 (×2): 10 mg via ORAL
  Filled 2018-07-07: qty 2

## 2018-07-07 MED ORDER — MEPERIDINE HCL 25 MG/ML IJ SOLN
6.2500 mg | INTRAMUSCULAR | Status: DC | PRN
  Filled 2018-07-07: qty 1

## 2018-07-07 MED ORDER — MIDAZOLAM HCL 2 MG/2ML IJ SOLN
INTRAMUSCULAR | Status: AC
  Filled 2018-07-07: qty 2

## 2018-07-07 MED ORDER — SCOPOLAMINE 1 MG/3DAYS TD PT72
MEDICATED_PATCH | TRANSDERMAL | Status: DC | PRN
  Administered 2018-07-07: 1 via TRANSDERMAL

## 2018-07-07 MED ORDER — KETOROLAC TROMETHAMINE 30 MG/ML IJ SOLN
INTRAMUSCULAR | Status: DC | PRN
  Administered 2018-07-07: 30 mg via INTRAVENOUS

## 2018-07-07 MED ORDER — LACTATED RINGERS IV SOLN
INTRAVENOUS | Status: DC
  Administered 2018-07-07 (×2): via INTRAVENOUS
  Filled 2018-07-07: qty 1000

## 2018-07-07 MED ORDER — FENTANYL CITRATE (PF) 100 MCG/2ML IJ SOLN
INTRAMUSCULAR | Status: DC | PRN
  Administered 2018-07-07: 20 ug via INTRATHECAL

## 2018-07-07 MED ORDER — MENTHOL 3 MG MT LOZG
1.0000 | LOZENGE | OROMUCOSAL | Status: DC | PRN
  Filled 2018-07-07: qty 9

## 2018-07-07 MED ORDER — ACETAMINOPHEN 325 MG PO TABS
325.0000 mg | ORAL_TABLET | ORAL | Status: DC | PRN
  Filled 2018-07-07: qty 2

## 2018-07-07 MED ORDER — LACTATED RINGERS IV SOLN
INTRAVENOUS | Status: DC
  Administered 2018-07-07: 07:00:00 via INTRAVENOUS
  Filled 2018-07-07: qty 1000

## 2018-07-07 MED ORDER — OXYCODONE HCL 5 MG PO TABS
5.0000 mg | ORAL_TABLET | Freq: Once | ORAL | Status: AC | PRN
  Administered 2018-07-07: 5 mg via ORAL
  Filled 2018-07-07: qty 1

## 2018-07-07 MED ORDER — TRAMADOL HCL 50 MG PO TABS
50.0000 mg | ORAL_TABLET | Freq: Four times a day (QID) | ORAL | Status: DC | PRN
  Filled 2018-07-07: qty 1

## 2018-07-07 MED ORDER — BUPIVACAINE IN DEXTROSE 0.75-8.25 % IT SOLN
INTRATHECAL | Status: DC | PRN
  Administered 2018-07-07: 1.6 mL via INTRATHECAL

## 2018-07-07 MED ORDER — ACETAMINOPHEN 160 MG/5ML PO SOLN
325.0000 mg | ORAL | Status: DC | PRN
  Filled 2018-07-07: qty 20.3

## 2018-07-07 MED ORDER — SERTRALINE HCL 50 MG PO TABS
50.0000 mg | ORAL_TABLET | Freq: Every day | ORAL | Status: DC
  Filled 2018-07-07: qty 1

## 2018-07-07 MED ORDER — SUCCINYLCHOLINE CHLORIDE 200 MG/10ML IV SOSY
PREFILLED_SYRINGE | INTRAVENOUS | Status: AC
  Filled 2018-07-07: qty 10

## 2018-07-07 MED ORDER — SODIUM CHLORIDE 0.9 % IV SOLN
2.0000 g | INTRAVENOUS | Status: AC
  Administered 2018-07-07: 2 g via INTRAVENOUS
  Filled 2018-07-07: qty 2

## 2018-07-07 MED ORDER — ONDANSETRON HCL 4 MG/2ML IJ SOLN
INTRAMUSCULAR | Status: AC
  Filled 2018-07-07: qty 2

## 2018-07-07 MED ORDER — DEXAMETHASONE SODIUM PHOSPHATE 10 MG/ML IJ SOLN
INTRAMUSCULAR | Status: DC | PRN
  Administered 2018-07-07: 5 mg via INTRAVENOUS

## 2018-07-07 MED ORDER — SODIUM CHLORIDE 0.9 % IV SOLN
INTRAVENOUS | Status: AC
  Filled 2018-07-07: qty 2

## 2018-07-07 MED ORDER — LACTATED RINGERS IV SOLN
INTRAVENOUS | Status: DC
  Administered 2018-07-07: 18:00:00 via INTRAVENOUS
  Filled 2018-07-07 (×2): qty 1000

## 2018-07-07 MED ORDER — SCOPOLAMINE 1 MG/3DAYS TD PT72
MEDICATED_PATCH | TRANSDERMAL | Status: AC
  Filled 2018-07-07: qty 1

## 2018-07-07 MED ORDER — FENTANYL CITRATE (PF) 100 MCG/2ML IJ SOLN
INTRAMUSCULAR | Status: AC
  Filled 2018-07-07: qty 2

## 2018-07-07 MED ORDER — OXYCODONE HCL 5 MG/5ML PO SOLN
5.0000 mg | Freq: Once | ORAL | Status: AC | PRN
  Filled 2018-07-07: qty 5

## 2018-07-07 MED ORDER — ONDANSETRON HCL 4 MG PO TABS
4.0000 mg | ORAL_TABLET | Freq: Four times a day (QID) | ORAL | Status: DC | PRN
  Filled 2018-07-07: qty 1

## 2018-07-07 MED ORDER — ONDANSETRON HCL 4 MG/2ML IJ SOLN
4.0000 mg | Freq: Four times a day (QID) | INTRAMUSCULAR | Status: DC | PRN
  Filled 2018-07-07: qty 2

## 2018-07-07 MED ORDER — LIDOCAINE 2% (20 MG/ML) 5 ML SYRINGE
INTRAMUSCULAR | Status: AC
  Filled 2018-07-07: qty 5

## 2018-07-07 MED ORDER — OXYCODONE HCL 5 MG PO TABS
ORAL_TABLET | ORAL | Status: AC
  Filled 2018-07-07: qty 2

## 2018-07-07 MED ORDER — KETOROLAC TROMETHAMINE 30 MG/ML IJ SOLN
INTRAMUSCULAR | Status: AC
  Filled 2018-07-07: qty 1

## 2018-07-07 MED ORDER — OXYCODONE HCL 5 MG PO TABS
ORAL_TABLET | ORAL | Status: AC
  Filled 2018-07-07: qty 1

## 2018-07-07 MED ORDER — IBUPROFEN 600 MG PO TABS
600.0000 mg | ORAL_TABLET | Freq: Four times a day (QID) | ORAL | Status: DC | PRN
  Administered 2018-07-07 – 2018-07-08 (×2): 600 mg via ORAL
  Filled 2018-07-07: qty 1

## 2018-07-07 MED ORDER — DEXAMETHASONE SODIUM PHOSPHATE 10 MG/ML IJ SOLN
INTRAMUSCULAR | Status: AC
  Filled 2018-07-07: qty 1

## 2018-07-07 SURGICAL SUPPLY — 41 items
BENZOIN TINCTURE PRP APPL 2/3 (GAUZE/BANDAGES/DRESSINGS) ×3 IMPLANT
CLOSURE WOUND 1/2 X4 (GAUZE/BANDAGES/DRESSINGS) ×1
DECANTER SPIKE VIAL GLASS SM (MISCELLANEOUS) IMPLANT
DERMABOND ADVANCED (GAUZE/BANDAGES/DRESSINGS) ×2
DERMABOND ADVANCED .7 DNX12 (GAUZE/BANDAGES/DRESSINGS) ×1 IMPLANT
DRSG OPSITE POSTOP 4X10 (GAUZE/BANDAGES/DRESSINGS) ×3 IMPLANT
DURAPREP 26ML APPLICATOR (WOUND CARE) ×3 IMPLANT
GLOVE BIO SURGEON STRL SZ 6.5 (GLOVE) ×2 IMPLANT
GLOVE BIO SURGEON STRL SZ7 (GLOVE) ×3 IMPLANT
GLOVE BIO SURGEONS STRL SZ 6.5 (GLOVE) ×1
GLOVE BIOGEL PI IND STRL 7.0 (GLOVE) ×2 IMPLANT
GLOVE BIOGEL PI IND STRL 7.5 (GLOVE) ×2 IMPLANT
GLOVE BIOGEL PI IND STRL 8.5 (GLOVE) ×2 IMPLANT
GLOVE BIOGEL PI INDICATOR 7.0 (GLOVE) ×4
GLOVE BIOGEL PI INDICATOR 7.5 (GLOVE) ×4
GLOVE BIOGEL PI INDICATOR 8.5 (GLOVE) ×4
GLOVE INDICATOR 8.5 STRL (GLOVE) ×6 IMPLANT
GOWN STRL REUS W/ TWL XL LVL3 (GOWN DISPOSABLE) ×2 IMPLANT
GOWN STRL REUS W/TWL XL LVL3 (GOWN DISPOSABLE) ×4
HIBICLENS CHG 4% 4OZ BTL (MISCELLANEOUS) IMPLANT
HOLDER FOLEY CATH W/STRAP (MISCELLANEOUS) ×3 IMPLANT
NEEDLE HYPO 22GX1.5 SAFETY (NEEDLE) IMPLANT
NS IRRIG 1000ML POUR BTL (IV SOLUTION) ×3 IMPLANT
PACK ABDOMINAL GYN (CUSTOM PROCEDURE TRAY) ×3 IMPLANT
PROTECTOR NERVE ULNAR (MISCELLANEOUS) IMPLANT
SPONGE LAP 18X18 RF (DISPOSABLE) IMPLANT
STRIP CLOSURE SKIN 1/2X4 (GAUZE/BANDAGES/DRESSINGS) ×2 IMPLANT
SUT MERSILENE 5MM BP 1 12 (SUTURE) ×3 IMPLANT
SUT MNCRL AB 0 CT1 27 (SUTURE) IMPLANT
SUT MON AB 2-0 SH 27 (SUTURE)
SUT MON AB 2-0 SH27 (SUTURE) IMPLANT
SUT PLAIN 3 0 SH 27IN (SUTURE) ×3 IMPLANT
SUT VIC AB 0 CT1 27 (SUTURE) ×4
SUT VIC AB 0 CT1 27XBRD ANBCTR (SUTURE) ×2 IMPLANT
SUT VIC AB 2-0 SH 27 (SUTURE)
SUT VIC AB 2-0 SH 27XBRD (SUTURE) IMPLANT
SUT VIC AB 4-0 KS 27 (SUTURE) ×3 IMPLANT
SYR BULB IRRIGATION 50ML (SYRINGE) ×3 IMPLANT
SYR CONTROL 10ML LL (SYRINGE) IMPLANT
TOWEL OR 17X24 6PK STRL BLUE (TOWEL DISPOSABLE) ×6 IMPLANT
TRAY FOLEY W/BAG SLVR 14FR (SET/KITS/TRAYS/PACK) ×3 IMPLANT

## 2018-07-07 NOTE — Progress Notes (Signed)
Up to BR with assist. Very slowly move,c/o pain also dizzy. Reminded pt to open eyes when up to BR and cough frequantly, use IS 10x's per 2hours while awake. Clear yellow urin with few small blood clots noted.  Pt asked if she can use bedpan next time, explain the benefit of ambulation. Continue to use toilet. Gave some crackers and Gingerale per requested.

## 2018-07-07 NOTE — Anesthesia Procedure Notes (Signed)
Spinal  Patient location during procedure: OR Start time: 07/07/2018 7:24 AM End time: 07/07/2018 7:27 AM Staffing Anesthesiologist: Janeece Riggers, MD Preanesthetic Checklist Completed: patient identified, site marked, surgical consent, pre-op evaluation, timeout performed, IV checked, risks and benefits discussed and monitors and equipment checked Spinal Block Patient position: sitting Prep: DuraPrep Patient monitoring: heart rate, cardiac monitor, continuous pulse ox and blood pressure Approach: midline Location: L2-3 Injection technique: single-shot Needle Needle type: Sprotte  Needle gauge: 24 G Needle length: 9 cm Assessment Sensory level: T4

## 2018-07-07 NOTE — Brief Op Note (Signed)
07/07/2018  8:12 AM  PATIENT:  Melody Zamora  28 y.o. female  PRE-OPERATIVE DIAGNOSIS:  CERVICAL INCOMPETENCE  POST-OPERATIVE DIAGNOSIS:  CERVICAL INCOMPETENCE  PROCEDURE:  Procedure(s): CERCLAGE ABDOMINAL (N/A)  SURGEON:  Surgeon(s) and Role:    * Dian Queen, MD - Primary    * Molli Posey, MD - Assisting  PHYSICIAN ASSISTANT:   ASSISTANTS: none   ANESTHESIA:   spinal  EBL:  25 mL   BLOOD ADMINISTERED:none  DRAINS: Urinary Catheter (Foley)   LOCAL MEDICATIONS USED:  NONE  SPECIMEN:  No Specimen  DISPOSITION OF SPECIMEN:  N/A  COUNTS:  YES  TOURNIQUET:  * No tourniquets in log *  DICTATION: .Other Dictation: Dictation Number dictated  PLAN OF CARE: Admit for overnight observation  PATIENT DISPOSITION:  PACU - hemodynamically stable.   Delay start of Pharmacological VTE agent (>24hrs) due to surgical blood loss or risk of bleeding: not applicable

## 2018-07-07 NOTE — Anesthesia Postprocedure Evaluation (Signed)
Anesthesia Post Note  Patient: Melody Zamora  Procedure(s) Performed: CERCLAGE ABDOMINAL (N/A Abdomen)     Patient location during evaluation: PACU Anesthesia Type: Spinal Level of consciousness: awake and alert Pain management: pain level controlled Vital Signs Assessment: post-procedure vital signs reviewed and stable Respiratory status: spontaneous breathing, nonlabored ventilation, respiratory function stable and patient connected to nasal cannula oxygen Cardiovascular status: blood pressure returned to baseline and stable Postop Assessment: no apparent nausea or vomiting Anesthetic complications: no    Last Vitals:  Vitals:   07/07/18 0845 07/07/18 0900  BP: 120/65   Pulse: 77 71  Resp: 10 17  Temp:    SpO2: 100% 100%    Last Pain:  Vitals:   07/07/18 0845  TempSrc:   PainSc: 0-No pain                 Malory Spurr

## 2018-07-07 NOTE — H&P (Signed)
28 year old female with history of cervical incompetence here for abdominal cerclage in anticipation of upcoming pregnancy. Failed cervical cerclage last pregnancy History of LEEP  Past Medical History:  Diagnosis Date  . Complication of anesthesia   . PONV (postoperative nausea and vomiting)   . Vaginal Pap smear, abnormal    Past Surgical History:  Procedure Laterality Date  . CERVICAL CERCLAGE N/A 12/15/2017   Procedure: CERCLAGE CERVICAL;  Surgeon: Tyson Dense, MD;  Location: [facility];  Service: Gynecology;  Laterality: N/A;  spoke w/ Lisa-OK at 8:30am, BS Virtual Rm 2  . DILATION AND EVACUATION N/A 01/18/2018   Procedure: DILATATION AND EVACUATION;  Surgeon: Everlene Farrier, MD;  Location: [place];  Service: Gynecology;  Laterality: N/A;  . LEEP     Prior to Admission medications   Medication Sig Start Date End Date Taking? Authorizing Provider  Biotin w/ Vitamins C & E (HAIR/SKIN/NAILS PO) Take 1 tablet by mouth daily.   Yes [provider]  NON FORMULARY Juice caps 2 tabs   Yes [provider]  sertraline (ZOLOFT) 50 MG tablet Take 50 mg by mouth daily.   Yes [provider]  Multiple Vitamins-Minerals (MULTIVITAMIN WITH MINERALS) tablet Take 1 tablet by mouth daily.    [provider]   Plaquenil [hydroxychloroquine] Family History  Problem Relation Age of Onset  . Hypertension Mother    Social History   Socioeconomic History  . Marital status: Single    Spouse name: Not on file  . Number of children: Not on file  . Years of education: Not on file  . Highest education level: Not on file  Occupational History  . Not on file  Social Needs  . Financial resource strain: Not hard at all  . Food insecurity    Worry: Never true    Inability: Never true  . Transportation needs    Medical: No    Non-medical: Not on file  Tobacco Use  . Smoking status: Never Smoker  . Smokeless tobacco: Never Used   Substance and Sexual Activity  . Alcohol use: No  . Drug use: No  . Sexual activity: Not Currently  Lifestyle  . Physical activity    Days per week: Not on file    Minutes per session: Not on file  . Stress: Only a little  Relationships  . Social Herbalist on phone: Not on file    Gets together: Not on file    Attends religious service: Not on file    Active member of club or organization: Not on file    Attends meetings of clubs or organizations: Not on file    Relationship status: Not on file  Other Topics Concern  . Not on file  Social History Narrative  . Not on file   General alert and oriented Lung CTAB Car RRR Abdomen is soft and non tender Pelvic short intravaginal portion of cervix  IMPRESSION: History of cervical incompetence  PLAN: Abdominal cerclage Risks reviewed Consent signed

## 2018-07-07 NOTE — Transfer of Care (Signed)
Immediate Anesthesia Transfer of Care Note  Patient: Melody Zamora  Procedure(s) Performed: CERCLAGE ABDOMINAL (N/A Abdomen)  Patient Location: PACU  Anesthesia Type:Spinal  Level of Consciousness: awake, alert  and oriented  Airway & Oxygen Therapy: Patient Spontanous Breathing and Patient connected to nasal cannula oxygen  Post-op Assessment: Report given to RN and Post -op Vital signs reviewed and stable  Post vital signs: Reviewed and stable  Last Vitals:  Vitals Value Taken Time  BP 115/44 07/07/18 0821  Temp    Pulse 82 07/07/18 0822  Resp 16 07/07/18 0822  SpO2 100 % 07/07/18 0822  Vitals shown include unvalidated device data.  Last Pain:  Vitals:   07/07/18 0557  TempSrc: Oral      Patients Stated Pain Goal: 3 (40/97/35 3299)  Complications: No apparent anesthesia complications

## 2018-07-08 ENCOUNTER — Encounter (HOSPITAL_BASED_OUTPATIENT_CLINIC_OR_DEPARTMENT_OTHER): Payer: Self-pay | Admitting: Obstetrics and Gynecology

## 2018-07-08 DIAGNOSIS — N883 Incompetence of cervix uteri: Secondary | ICD-10-CM | POA: Diagnosis not present

## 2018-07-08 MED ORDER — OXYCODONE HCL 5 MG PO TABS: 5.0000 mg | ORAL_TABLET | ORAL | 0 refills | Status: DC | PRN

## 2018-07-08 MED ORDER — IBUPROFEN 600 MG PO TABS: 600.0000 mg | ORAL_TABLET | Freq: Four times a day (QID) | ORAL | 0 refills | Status: DC | PRN

## 2018-07-08 MED ORDER — IBUPROFEN 200 MG PO TABS
ORAL_TABLET | ORAL | Status: AC
  Filled 2018-07-08: qty 3

## 2018-07-08 MED ORDER — OXYCODONE HCL 5 MG PO TABS
ORAL_TABLET | ORAL | Status: AC
  Filled 2018-07-08: qty 2

## 2018-07-08 NOTE — Discharge Summary (Signed)
  Admission Diagnosis: Cervical incompetence  Discharge Diagnosis: Same  Hospital Course: 28 year old with cervical incompetence underwent uncomplicated Abdominal cerclage. Did very well postop. POD # 1 was ambulating, voiding and tolerating regular diet.  BP 116/76 (BP Location: Left Arm) Comment (BP Location): forearm  Pulse 98   Temp 98.3 F (36.8 C)   Resp 20   Ht 5\' 4"  (1.626 m)   Wt 134.2 kg   LMP 07/01/2018   SpO2 99%   BMI 50.79 kg/m  No results found for this or any previous visit (from the past 24 hour(s)). Abdomen soft and non tender Bandage clean and dry  Discharged home in excellent condition Rx Oxycodone and Ibuprofen Follow up in 1 week  Discharge precautions given to patient

## 2018-07-09 NOTE — Op Note (Signed)
NAMEMARYON, KEMNITZ MEDICAL RECORD [id number] ACCOUNT [id number] DATE OF BIRTH:Oct 05, 1990 FACILITY: [facility] LOCATION: [facility] PHYSICIAN:Reannah Totten Lynett Fish, MD  OPERATIVE REPORT  DATE OF PROCEDURE:  07/07/2018  PREOPERATIVE DIAGNOSIS:  Cervical incompetence.  POSTOPERATIVE DIAGNOSIS:  Cervical incompetence.  PROCEDURE:  Abdominal cerclage.  SURGEON:  Dian Queen, MD  ASSISTANT:  Molli Posey, MD  ANESTHESIA: Spinal  ESTIMATED BLOOD LOSS: Minimal   COMPLICATIONS:  None  DRAINS:  Foley catheter.  DESCRIPTION OF PROCEDURE:  The patient was taken to the operating room.  Her spinal was placed by Dr. Rayetta Pigg without incident.  She was then prepped and draped in the usual sterile fashion after a Foley catheter was inserted.  A low transverse incision was  made, carried down to the fascia.  The fascia was scored in the midline and extended laterally.  Rectus muscles were separated in the midline.  The peritoneum was entered bluntly.  We then placed a self-retaining retractor in the abdominal cavity and  the large and small bowel were placed in the  abdomen.  We then visualized the uterus and cervix.  The uterus appeared normal.  We then elevated it and then identified the uterine vessels at the level of the internal os.  Initially starting on the right  side, I used a Babcock to gently retract the vessels away from the internal os and place the first stitch on the right side at the level of the internal os from front to back.  This was done in a similar fashion, and we were able to avoid the vessels.  We then  tied the knot  posteriorly.  There was no bleeding noted.  The uterus was returned to the abdomen gently.  All instruments and laparotomy pads were removed.  The peritoneum was closed using 0 Vicryl, and the fascia was closed using 0  Vicryl.  The skin was closed with plain gut suture interrupted, and the skin was closed with 3-0 Vicryl.  Benzoin and Steri-Strips and a  honeycomb dressing were applied.  All sponge, lap and instrument counts were correct x2.  The patient went to the  recovery room and will be observed overnight.  LN/NUANCE  D:07/08/2018 T:07/08/2018 JOB:[id number]

## 2018-07-18 NOTE — Addendum Note (Signed)
Addendum  created 07/18/18 1026 by Bonney Aid, CRNA   Charge Capture section accepted

## 2018-07-18 NOTE — Addendum Note (Signed)
Addendum  created 07/18/18 1011 by Bonney Aid, CRNA   Charge Capture section accepted

## 2018-09-27 ENCOUNTER — Ambulatory Visit: Payer: No Typology Code available for payment source | Admitting: Family Medicine

## 2018-09-27 NOTE — Progress Notes (Signed)
  Subjective:     Patient ID: Melody Zamora, female   DOB: September 07, 1990, 28 y.o.   MRN: [id number]  HPI Melody Zamora presents [facility] today to discuss her history of miscarriage and current issues of amenorrhea since June 2020. She is being treated for this by her OBGYN Dr. Helane Rima, but she wanted another opinion on her treatment.   She is a gravida 2 para 0. She has had two previous miscarriages in the second trimester d/t cervical insufficiency - last being in December 2019. Hx of vaginal cerclages that have failed. She recently underwent an abdominal cerclage in June 2020. Her LMP was in June 2020 prior to procedure. She had regular cycles prior to this. Has never had irregular cycles before. She saw Dr. Helane Rima last week, pregnancy test there was negative, she was rx medroxyprogesterone to stimulate her cycle. She is currently trying to conceive. Denies any other problems. Denies any concerns with depression or mood. She states she has a good support system and talks to her mom regularly which is helpful for her.  Review of Systems See HPI    Objective:   Physical Exam Constitutional:      General: She is not in acute distress.    Appearance: Normal appearance. She is obese. She is not toxic-appearing.  HENT:     Head: Normocephalic and atraumatic.  Pulmonary:     Effort: Pulmonary effort is normal. No respiratory distress.  Skin:    General: Skin is warm and dry.  Neurological:     Mental Status: She is alert and oriented to person, place, and time.  Psychiatric:        Mood and Affect: Mood normal.        Behavior: Behavior normal.        Thought Content: Thought content normal.        Assessment:     Amenorrhea     Plan:     1. Discussed with patient that her treatment course is appropriate. Encouraged her to continue follow up with her OBGYN. Continue taking the medroxyprogesterone and if her period does not start with this then she should contact her  OB as they had discussed. Recommended healthy eating and regular exercise to promote a healthy weight. Continue taking prenatal vitamin. Encouraged her to seek out support if needed for her emotional health and wellbeing. Encouraged her to come back and see me should she need more support or have further questions. She verbalized understanding.

## 2018-09-27 NOTE — Progress Notes (Deleted)
Last period was in June, normal cycles previously. Last week negative pregnancy test - urine testing at office.   Abdominal cerclage in June -   G2 -   Was rx medroxyprogesterone - started last Thursday, also taking PNV /

## 2018-11-19 ENCOUNTER — Other Ambulatory Visit: Payer: Self-pay

## 2018-11-19 ENCOUNTER — Emergency Department (HOSPITAL_COMMUNITY)
Admission: EM | Admit: 2018-11-19 | Discharge: 2018-11-19 | Disposition: A | Discharge status: Home / Self Care | Payer: PRIVATE HEALTH INSURANCE | Attending: Emergency Medicine | Admitting: Emergency Medicine

## 2018-11-19 ENCOUNTER — Encounter (HOSPITAL_COMMUNITY): Payer: Self-pay

## 2018-11-19 ENCOUNTER — Emergency Department (HOSPITAL_COMMUNITY): Payer: PRIVATE HEALTH INSURANCE

## 2018-11-19 DIAGNOSIS — M25512 Pain in left shoulder: Secondary | ICD-10-CM | POA: Insufficient documentation

## 2018-11-19 DIAGNOSIS — R0789 Other chest pain: Secondary | ICD-10-CM | POA: Insufficient documentation

## 2018-11-19 DIAGNOSIS — R079 Chest pain, unspecified: Secondary | ICD-10-CM | POA: Diagnosis not present

## 2018-11-19 LAB — CBC
HCT: 41.6 % (ref 36.0–46.0)
Hemoglobin: 13.2 g/dL (ref 12.0–15.0)
MCH: 29.5 pg (ref 26.0–34.0)
MCHC: 31.7 g/dL (ref 30.0–36.0)
MCV: 93.1 fL (ref 80.0–100.0)
Platelets: 327 10*3/uL (ref 150–400)
RBC: 4.47 MIL/uL (ref 3.87–5.11)
RDW: 13.8 % (ref 11.5–15.5)
WBC: 7.7 10*3/uL (ref 4.0–10.5)
nRBC: 0 % (ref 0.0–0.2)

## 2018-11-19 LAB — BASIC METABOLIC PANEL
Anion gap: 10 (ref 5–15)
BUN: 14 mg/dL (ref 6–20)
CO2: 23 mmol/L (ref 22–32)
Calcium: 8.6 mg/dL — ABNORMAL LOW (ref 8.9–10.3)
Chloride: 105 mmol/L (ref 98–111)
Creatinine, Ser: 0.8 mg/dL (ref 0.44–1.00)
GFR calc Af Amer: >60 mL/min (ref >60)
GFR calc non Af Amer: >60 mL/min (ref >60)
Glucose, Bld: 119 mg/dL — ABNORMAL HIGH (ref 70–99)
Potassium: 3.8 mmol/L (ref 3.5–5.1)
Sodium: 138 mmol/L (ref 135–145)

## 2018-11-19 LAB — I-STAT BETA HCG BLOOD, ED (MC, WL, AP ONLY): I-stat hCG, quantitative: <5 m[IU]/mL (ref <5)

## 2018-11-19 LAB — TROPONIN I (HIGH SENSITIVITY): Troponin I (High Sensitivity): 3 ng/L (ref <18)

## 2018-11-19 NOTE — ED Triage Notes (Signed)
Pt coming from home c/o sharp left shoulder pain when moving. Pt says pain moves to chest with movement. 4/10 pain. No N/V/D or shortness of breath

## 2018-11-19 NOTE — ED Provider Notes (Signed)
[facility]-EMERGENCY DEPT Provider Note   CSN: [id number] Arrival date & time: 11/19/18  0536     History   Chief Complaint Chief Complaint  Patient presents with  . Shoulder Pain    HPI Melody Zamora is a 28 y.o. female.     The history is provided by the patient.  Shoulder Pain Location:  Shoulder (left chest wall) Shoulder location:  L shoulder Injury: no   Pain details:    Quality:  Aching   Severity:  Mild   Onset quality:  Gradual   Timing:  Intermittent   Progression:  Waxing and waning Relieved by:  Nothing Worsened by:  Movement Associated symptoms: no back pain, no decreased range of motion, no fatigue, no fever, no muscle weakness, no neck pain, no numbness, no stiffness, no swelling and no tingling     Past Medical History:  Diagnosis Date  . Complication of anesthesia   . PONV (postoperative nausea and vomiting)   . Vaginal Pap smear, abnormal     Patient Active Problem List   Diagnosis Date Noted  . Cervical incompetence 07/07/2018  . Cervical insufficiency during pregnancy in second trimester, antepartum 01/18/2018  . Cervical cerclage suture present 12/15/2017  . SAB (spontaneous abortion) 05/28/2016  . 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 Language barrier, cultural differences 05/27/2016  . Abnormal Pap smear of cervix 05/27/2016  . Preterm premature rupture of membranes (PPROM) with unknown onset of labor 05/27/2016    Past Surgical History:  Procedure Laterality Date  . ABDOMINAL CERCLAGE N/A 07/07/2018   Procedure: CERCLAGE ABDOMINAL;  Surgeon: Marcelle Overlie, MD;  Location: [facility];  Service: Gynecology;  Laterality: N/A;  . CERVICAL CERCLAGE N/A 12/15/2017   Procedure: CERCLAGE CERVICAL;  Surgeon: Ranae Pila, MD;  Location: [facility]TH[facility] SU[facility];  Service: Gynecology;  Laterality: N/A;  spoke w/ Lisa-OK at 8:30am, BS Virtual Rm 2  . DILATION AND EVACUATION N/A 01/18/2018   Procedure:  DILATATION AND EVACUATION;  Surgeon: Harold Hedge, MD;  Location: [facility];  Service: Gynecology;  Laterality: N/A;  . LEEP       OB History    Gravida  2   Para  1   Term      Preterm      AB  1   Living  0     SAB  1   TAB      Ectopic      Multiple  0   Live Births  1            Home Medications    Prior to Admission medications   Medication Sig Start Date End Date Taking? Authorizing Provider  Biotin w/ Vitamins C & E (HAIR/SKIN/NAILS PO) Take 1 tablet by mouth daily.   Yes [provider]  Multiple Vitamins-Minerals (MULTIVITAMIN WITH MINERALS) tablet Take 1 tablet by mouth daily.   Yes [provider]  NON FORMULARY Take 2 capsules by mouth daily. Medication: Juice caps   Yes [provider]    Family History Family History  Problem Relation Age of Onset  . Hypertension Mother     Social History Social History   Tobacco Use  . Smoking status: Never Smoker  . Smokeless tobacco: Never Used  Substance Use Topics  . Alcohol use: No  . Drug use: No     Allergies   Plaquenil [hydroxychloroquine]   Review of Systems Review of Systems  Constitutional: Negative for chills, fatigue and fever.  HENT: Negative for ear pain and sore throat.   Eyes: Negative for pain and visual disturbance.  Respiratory: Negative for cough and shortness of breath.   Cardiovascular: Positive for chest pain (chest wall pain). Negative for palpitations.  Gastrointestinal: Negative for abdominal pain and vomiting.  Genitourinary: Negative for dysuria and hematuria.  Musculoskeletal: Negative for arthralgias, back pain, neck pain and stiffness.  Skin: Negative for color change and rash.  Neurological: Negative for seizures and syncope.  All other systems reviewed and are negative.    Physical Exam Updated Vital Signs  ED Triage Vitals  Enc Vitals Group     BP 11/19/18 0551 (!) 160/111     Pulse Rate 11/19/18 0551 92      Resp 11/19/18 0551 18     Temp 11/19/18 0551 98.2 F (36.8 C)     Temp Source 11/19/18 0551 Oral     SpO2 11/19/18 0551 100 %     Weight 11/19/18 0551 285 lb (129.3 kg)     Height 11/19/18 0551 5\' 4"  (1.626 m)     Head Circumference --      Peak Flow --      Pain Score 11/19/18 0559 4     Pain Loc --      Pain Edu? --      Excl. in GC? --     Physical Exam Vitals signs and nursing note reviewed.  Constitutional:      General: She is not in acute distress.    Appearance: She is well-developed.  HENT:     Head: Normocephalic and atraumatic.     Nose: Nose normal.     Mouth/Throat:     Mouth: Mucous membranes are moist.  Eyes:     Extraocular Movements: Extraocular movements intact.     Conjunctiva/sclera: Conjunctivae normal.     Pupils: Pupils are equal, round, and reactive to light.  Neck:     Musculoskeletal: Normal range of motion and neck supple. No muscular tenderness.  Cardiovascular:     Rate and Rhythm: Normal rate and regular rhythm.     Pulses: Normal pulses.     Heart sounds: Normal heart sounds. No murmur.  Pulmonary:     Effort: Pulmonary effort is normal. No respiratory distress.     Breath sounds: Normal breath sounds.  Abdominal:     General: Abdomen is flat.     Palpations: Abdomen is soft.     Tenderness: There is no abdominal tenderness.  Musculoskeletal: Normal range of motion.        General: Tenderness (TTP to left side of chest wall/left shoulder) present.  Skin:    General: Skin is warm and dry.     Capillary Refill: Capillary refill takes less than 2 seconds.  Neurological:     General: No focal deficit present.     Mental Status: She is alert and oriented to person, place, and time.     Sensory: No sensory deficit.     Motor: No weakness.  Psychiatric:        Mood and Affect: Mood normal.      ED Treatments / Results  Labs (all labs ordered are listed, but only abnormal results are displayed) Labs Reviewed  BASIC METABOLIC PANEL -  Abnormal; Notable for the following components:      Result Value   Glucose, Bld 119 (*)    Calcium 8.6 (*)    All other components within normal limits  CBC  I-STAT BETA HCG  BLOOD, ED ([facility], [facility], [facility] ONLY)  TROPONIN I (HIGH SENSITIVITY)    EKG EKG Interpretation  Date/Time:  Saturday November 19 2018 10:48:16 EDT Ventricular Rate:  78 PR Interval:    QRS Duration: 81 QT Interval:  370 QTC Calculation: 422 R Axis:   72 Text Interpretation:  Sinus rhythm Borderline T abnormalities, lateral leads Confirmed by Lennice Sites [phone]) on 11/19/2018 11:01:48 AM   Radiology Dg Chest 2 View  Result Date: 11/19/2018 CLINICAL DATA:  Chest pain. EXAM: CHEST - 2 VIEW COMPARISON:  09/27/2015 FINDINGS: The heart size and mediastinal contours are within normal limits. Both lungs are clear. The visualized skeletal structures are unremarkable. IMPRESSION: No active cardiopulmonary disease. Electronically Signed   By: Kerby Moors M.D.   On: 11/19/2018 06:43    Procedures Procedures (including critical care time)  Medications Ordered in ED Medications - No data to display   Initial Impression / Assessment and Plan / ED Course  I have reviewed the triage vital signs and the nursing notes.  Pertinent labs & imaging results that were available during my care of the patient were reviewed by me and considered in my medical decision making (see chart for details).        Melody Zamora is a 28 year old female who presents to the ED with left shoulder pain.  Patient with normal vitals.  No fever.  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  Likely has MSK pain.  Reproducible tenderness over the left chest wall, left shoulder.  She did have a cardiac work-up initiated in the first look process.  Chest x-ray showed no signs of pneumonia, no pneumothorax, no pleural effusion.  Patient is PERC negative and doubt PE.  Troponin is negative and EKG shows no ischemic changes.  I  interpreted EKG and lab work.  Overall no significant anemia, electrolyte abnormality, kidney injury.  Suspect muscle related pain.  Recommend Tylenol, Motrin, rest.  Given reassurance and discharged from the ED in good condition.  This chart was dictated using voice recognition software.  Despite best efforts to proofread,  errors can occur which can change the documentation meaning.    Final Clinical Impressions(s) / ED Diagnoses   Final diagnoses:  Acute pain of left shoulder    ED Discharge Orders    None       Lennice Sites, DO 11/19/18 1209

## 2019-01-21 IMAGING — US US MFM OB LIMITED
1 series · 14 of 14 positions shown · non-contrast
Comparison: none

[Series 1: us mfm ob limited · 14 of 14 slices shown]
[im 1/14]
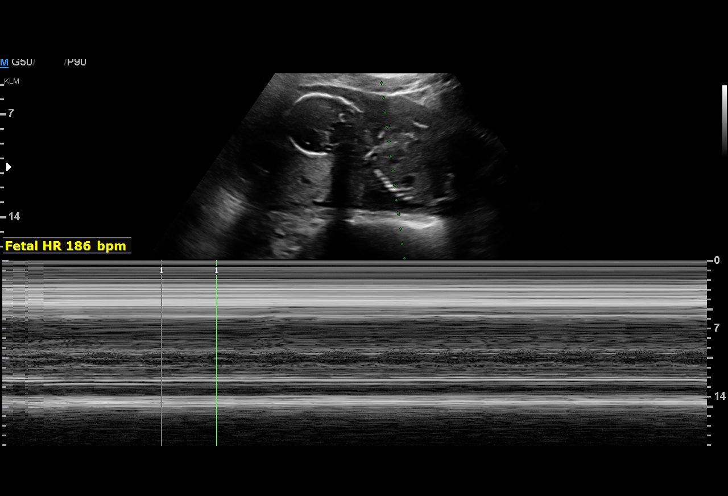
[im 2/14]
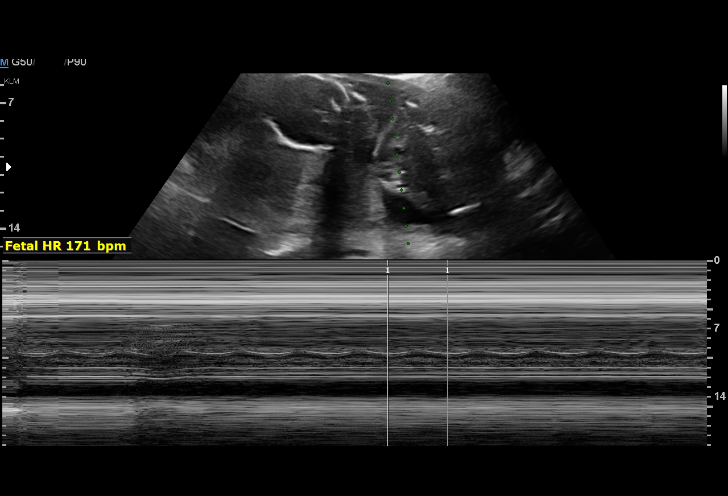
[im 3/14]
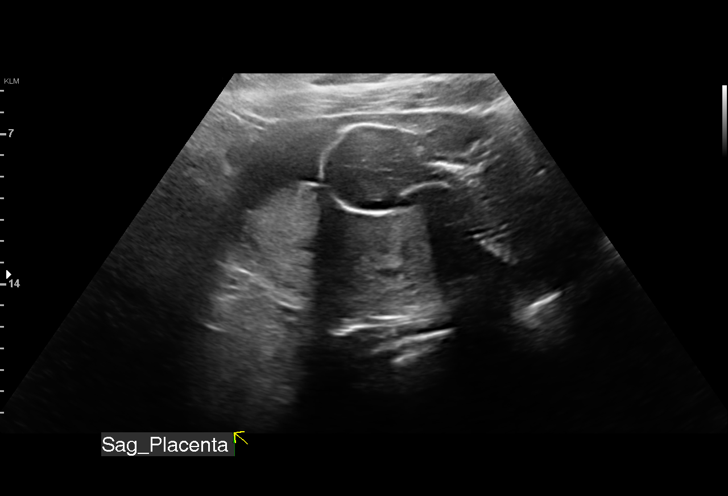
[im 4/14]
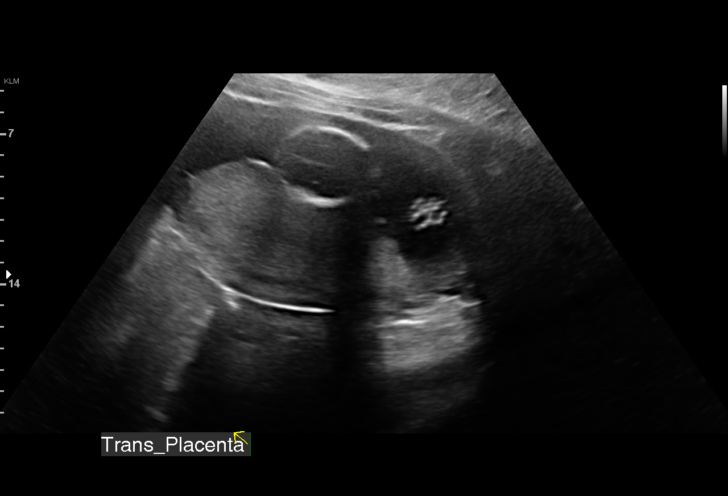
[im 5/14]
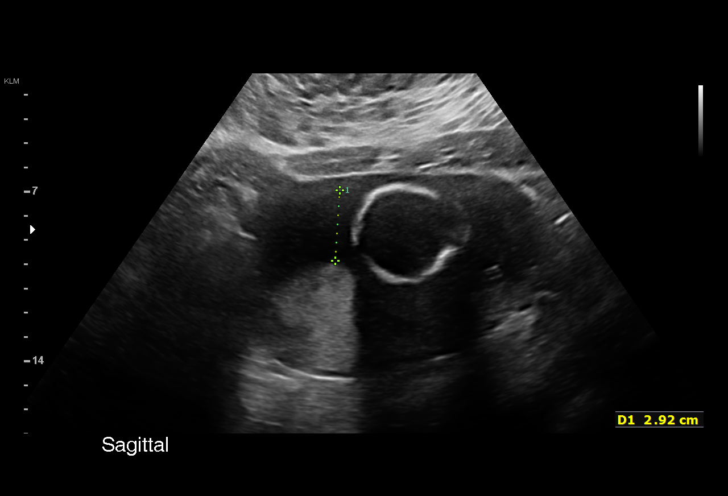
[im 6/14]
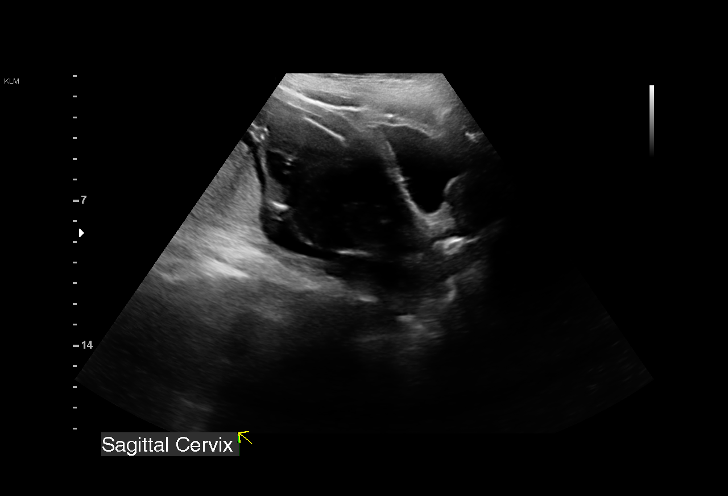
[im 7/14]
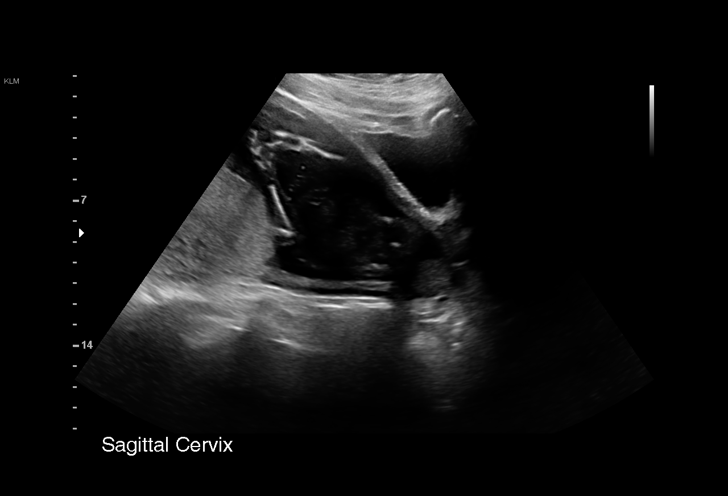
[im 8/14]
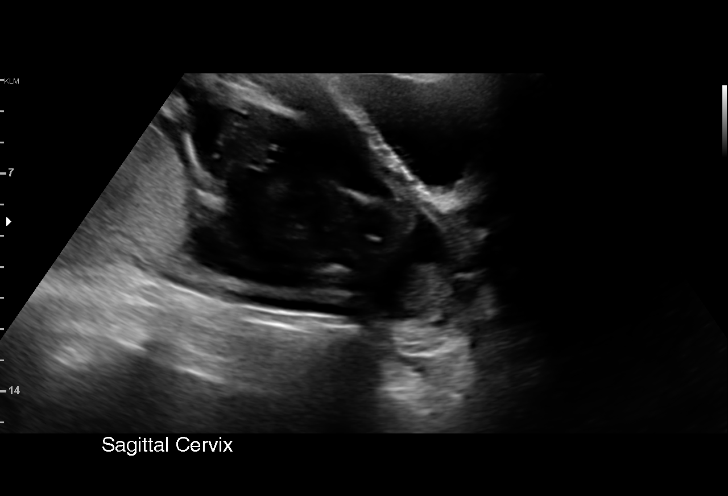
[im 9/14]
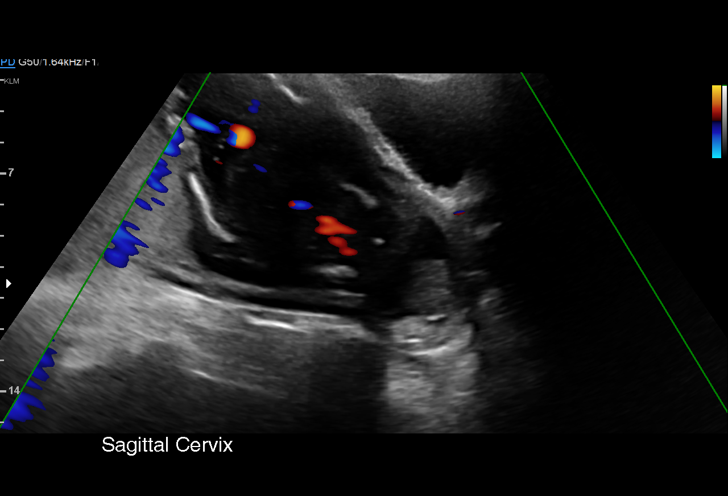
[im 10/14]
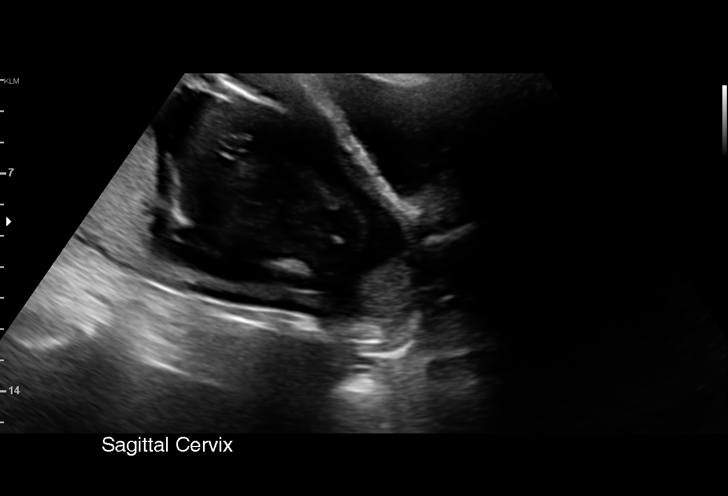
[im 11/14]
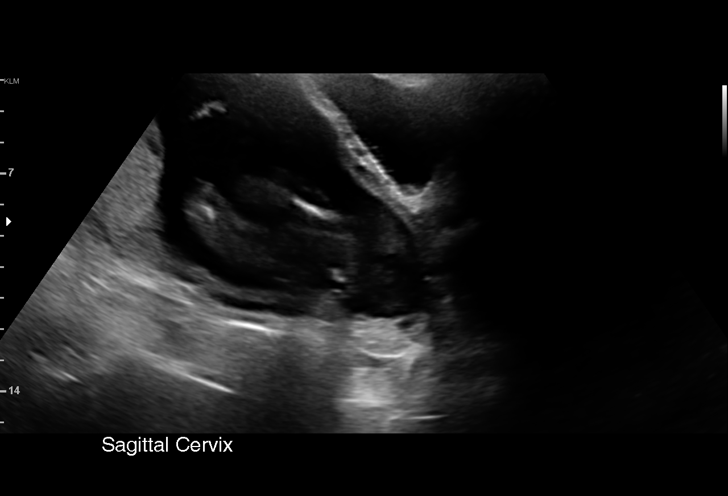
[im 12/14]
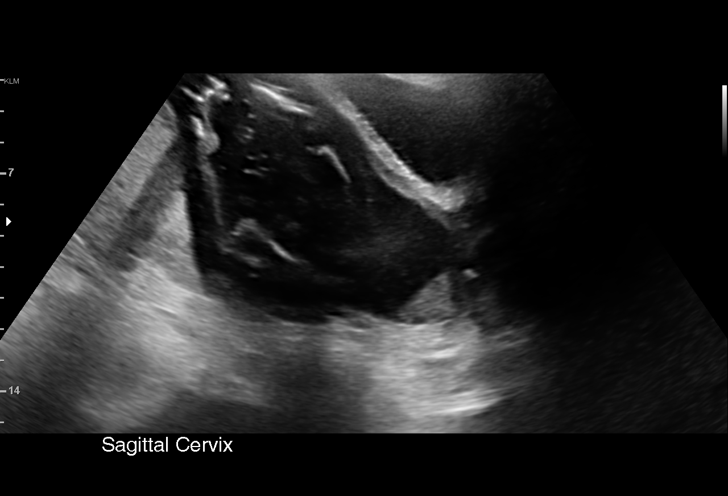
[im 13/14]
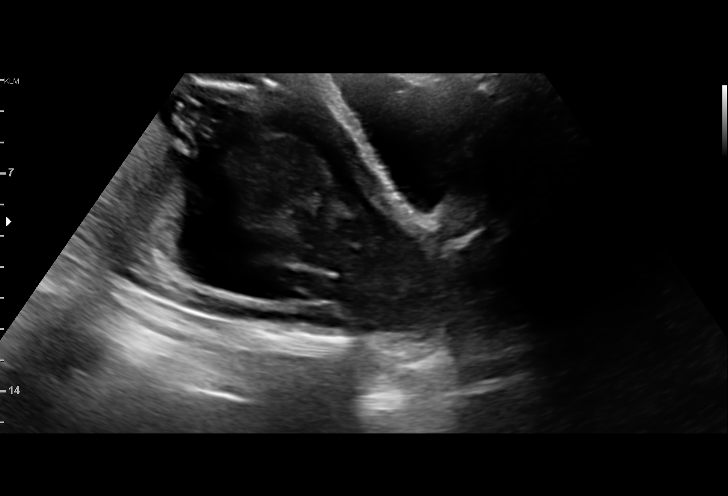
[im 14/14]
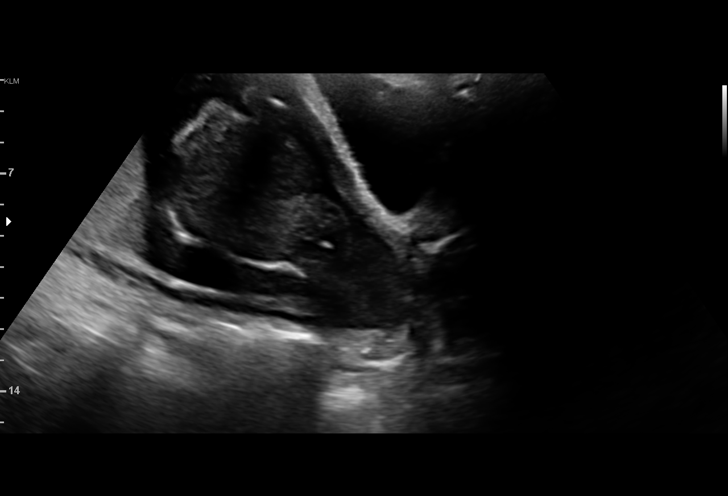

[14 of 14 positions shown; findings below may reference images not displayed]

Attending:        Marc Samuel Anathas        Secondary Phy.:   [facility]-
                                                            MAU/Triage

 ----------------------------------------------------------------------

 ----------------------------------------------------------------------
Indications

  Cervical cerclage suture present, second
  trimester
  19 weeks gestation of pregnancy
  Vaginal discharge during pregnancy in
  second trimester
  Poor obstetric history: Previous midtrimester
  loss
 ----------------------------------------------------------------------
Fetal Evaluation

 Num Of Fetuses:         1
 Fetal Heart Rate(bpm):  171
 Cardiac Activity:       Observed
 Presentation:           Breech
 Placenta:               Posterior
 P. Cord Insertion:      Visualized

 Amniotic Fluid
 AFI FV:      Within normal limits

                             Largest Pocket(cm)
                             3
OB History

 Gravidity:    3         Term:   1        Prem:   0        SAB:   1
 TOP:          0       Ectopic:  0        Living: 0
Gestational Age
 Best:          19w 1d     Det. By:  Early Ultrasound         EDD:   06/13/18
Cervix Uterus Adnexa

 Cervix
 Appears dilated, see comments. Cerclage visualized.
Impression

 Ms. Billiot, Emme3 P0 at 19w 1d gestation, is being evaluated in
 the [facility]. She was sent over from your office with dilated
 cervix. Patient reports mild uterine cramping, but no vaginal
 bleeding.

 She had prophylactic cerclage on 12/15/17 at [REDACTED] ([facility]). Subsequently, she had revision cerclage at
 [facility] 10 days ago (01/08/18). The cerclage was
 placed proximal to the first stitch.

 On transvaginal ultrasound performed on 01/14/18, the cervix
 measured 5 millimeters with funneling.

 Gyn history: History of LEEP.
 Ob history: 1) Early pregnancy loss (4 weeks) in [place]. 2) 17-
 week pregnancy loss in May 2016 (cervical incompetence).

 On ultrasound, amniotic fluid is normal and good fetal activity
 is seen. Footling presentation. The cervical canal is
 completely dilated on transabdominal scan.
 I explained the findings and recommended sterile-speculum
 and vaginal examination.
 On speculum exam, the cervix was seen to be dilated and the
 membrane was visible. On vaginal examination, the cervix
 was about 4 cm dilated with membrane projecting into the
 vagina. Cerclage is felt and I did not attempt a detailed
 examination to check the intergrity of stitches.

 I counseled the couple on the findings. The likelihood of
 spontaneous miscarriage is very high. Given that she has
 uterine cramping, I recommended removal of cerclages (to
 prevent cervical tear).

 Discussed with Dr. Grimfantasy.

Recommendations

 -Cerclage to be removed (2 stitches)
                 Arendt, Ewelajna

## 2019-04-11 ENCOUNTER — Ambulatory Visit: Payer: PRIVATE HEALTH INSURANCE | Admitting: Family Medicine

## 2019-04-11 VITALS — BP 124/90 | HR 86 | Ht 64.0 in | Wt 290.0 lb

## 2019-04-11 DIAGNOSIS — Z789 Other specified health status: Secondary | ICD-10-CM

## 2019-04-11 NOTE — Progress Notes (Signed)
Subjective:     Patient ID: Melody Zamora, female   DOB: 1990-10-21, 29 y.o.   MRN: [id number]  HPI Melody Zam[facility] for her required wellness visit for her insurance. She states she does not have a PCP. She did have a physical last year with Dr. Vincente Zamora, her OBGYN. Pap smear was done then. She reports LMP on 3/9. She is currently trying to conceive, has a hx of two miscarriages. She reports wanting to lose weight, would like to establish care with a PCP who can help her with this goal. She denies any other problems or concerns at this time.   Past Medical History:  Diagnosis Date  . Complication of anesthesia   . PONV (postoperative nausea and vomiting)   . Vaginal Pap smear, abnormal    Allergies  Allergen Reactions  . Plaquenil [Hydroxychloroquine] Itching    Unable to sleep for 3 days     Current Outpatient Medications:  Marland Kitchen  Multiple Vitamins-Minerals (MULTIVITAMIN WITH MINERALS) tablet, Take 1 tablet by mouth daily., Disp: , Rfl:    Review of Systems  Constitutional: Negative for chills, fatigue, fever and unexpected weight change.  HENT: Negative for congestion, ear pain, sinus pressure, sinus pain and sore throat.   Eyes: Negative for discharge and visual disturbance.  Respiratory: Negative for cough, shortness of breath and wheezing.   Cardiovascular: Negative for chest pain and leg swelling.  Gastrointestinal: Negative for abdominal pain, blood in stool, constipation, diarrhea, nausea and vomiting.  Genitourinary: Negative for difficulty urinating and hematuria.  Skin: Negative for color change.  Neurological: Negative for dizziness, weakness, light-headedness and headaches.  Hematological: Negative for adenopathy.  All other systems reviewed and are negative.      Objective:   Physical Exam Vitals reviewed.  Constitutional:      General: She is not in acute distress.    Appearance: Normal appearance. She is well-developed.   HENT:     Head: Normocephalic and atraumatic.  Eyes:     General:        Right eye: No discharge.        Left eye: No discharge.  Cardiovascular:     Rate and Rhythm: Normal rate and regular rhythm.     Heart sounds: Normal heart sounds.  Pulmonary:     Effort: Pulmonary effort is normal. No respiratory distress.     Breath sounds: Normal breath sounds.  Musculoskeletal:     Cervical back: Neck supple.  Skin:    General: Skin is warm and dry.  Neurological:     Mental Status: She is alert and oriented to person, place, and time.  Psychiatric:        Mood and Affect: Mood normal.        Behavior: Behavior normal.    Today's Vitals   04/11/19 1433  BP: 124/90  Pulse: 86  SpO2: 98%  Weight: 290 lb (131.5 kg)  Height: 5\' 4"  (1.626 m)   Body mass index is 49.78 kg/m.      Assessment:     Participant in health and wellness plan      Plan:     1. Encouraged pt to establish care with PCP, handout given with number for [facility] physician referral service to help with finding a PCP.  2. She should f/u with her OBGYN for a referral to maternal-fetal medicine specialist. 3. Encouraged efforts at weight loss, healthy diet and exercise. Should work with her PCP on a weight  loss plan.  4. F/u here prn.

## 2019-09-12 ENCOUNTER — Encounter: Payer: Self-pay | Admitting: Student

## 2019-09-12 ENCOUNTER — Other Ambulatory Visit: Payer: Self-pay

## 2019-09-12 ENCOUNTER — Ambulatory Visit (INDEPENDENT_AMBULATORY_CARE_PROVIDER_SITE_OTHER): Payer: Medicaid Other | Admitting: Student

## 2019-09-12 VITALS — BP 130/85 | HR 90 | Temp 98.1°F | Ht 64.0 in | Wt 293.2 lb

## 2019-09-12 DIAGNOSIS — R0789 Other chest pain: Secondary | ICD-10-CM

## 2019-09-12 DIAGNOSIS — N911 Secondary amenorrhea: Secondary | ICD-10-CM | POA: Diagnosis not present

## 2019-09-12 DIAGNOSIS — Z3202 Encounter for pregnancy test, result negative: Secondary | ICD-10-CM | POA: Diagnosis not present

## 2019-09-12 DIAGNOSIS — Z0000 Encounter for general adult medical examination without abnormal findings: Secondary | ICD-10-CM | POA: Diagnosis present

## 2019-09-12 DIAGNOSIS — R87612 Low grade squamous intraepithelial lesion on cytologic smear of cervix (LGSIL): Secondary | ICD-10-CM

## 2019-09-12 DIAGNOSIS — Z23 Encounter for immunization: Secondary | ICD-10-CM | POA: Diagnosis not present

## 2019-09-12 DIAGNOSIS — Z6841 Body Mass Index (BMI) 40.0 and over, adult: Secondary | ICD-10-CM | POA: Diagnosis not present

## 2019-09-12 DIAGNOSIS — R198 Other specified symptoms and signs involving the digestive system and abdomen: Secondary | ICD-10-CM

## 2019-09-12 DIAGNOSIS — R109 Unspecified abdominal pain: Secondary | ICD-10-CM | POA: Diagnosis not present

## 2019-09-12 LAB — POCT GLYCOSYLATED HEMOGLOBIN (HGB A1C): Hemoglobin A1C: 5.9 % — AB (ref 4.0–5.6)

## 2019-09-12 LAB — POCT URINE PREGNANCY: Preg Test, Ur: NEGATIVE

## 2019-09-12 LAB — GLUCOSE, CAPILLARY: Glucose-Capillary: 73 mg/dL (ref 70–99)

## 2019-09-12 NOTE — Assessment & Plan Note (Signed)
-  COVID vaccine - planning to get it through her work -PAP smear - reports PAP smear in 2019 -TDAP administered -lipid panel pending -A1c 5.9 today -lipid panel, A1c -will follow up with OBGYN for Pap smear

## 2019-09-12 NOTE — Assessment & Plan Note (Signed)
Recently saw nutritionist at [facility] last month and received education and helpful reading materials. With these diet changes and exercise, reports loss of 8-9 pounds. She has cut down on rice and has stopped eating after 7pm. She requests medication to help with weight loss.   -encouraged her on these lifestyle modifications -with her pre-diabetic A1c, would like to start semaglutide. Unsure if this would be safe with her trying to achieve pregnancy. Patient will confirm this with her OBGYN after she establishes with a new one.

## 2019-09-12 NOTE — Patient Instructions (Signed)
Thank you for allowing [place] to be a part of your care today, it was pleasure seeing you. We discussed your amenorrhea and weight.  I am checking these labs: TSH, FSH, prolactin, DHEA-sulfate, A1c, lipid panel  Please find an OBGYN practice that you would like to establish care with to continue your amenorrhea workup.  I am glad that you are motivated to lose weight and are plugged into the bariatric clinic. We discussed starting semaglutide to assist with weight loss. Please discuss with your OB whether this would be safe to use in pregnancy since you are trying to become pregnant.   We administered a TDAP shot today.  Please follow up in 3 months. Feel free to call her sooner for any changes or concerns.   Thank you, and please call the [facility] Medicine Clinic at [phone] if you have any questions.  Best, Dr. Imogene Burn

## 2019-09-12 NOTE — Assessment & Plan Note (Signed)
She was sitting down when she felt sharp substernal chest pain this Sunday. This resolved spontaneously in 2-3 minutes. No n/v, diaphoresis. Had previous similar episode in October with workup in ED including CXR, EKG, troponin which were negative.  -monitor for concerning symptoms

## 2019-09-12 NOTE — Progress Notes (Signed)
   CC: establish care, amenorrhea  HPI:  Melody Zamora is a 29 y.o. female with history as below presenting to establish care, and also complaining of amenorrhea. Please refer to problem based charting for further details of assessment and plan of current problem and chronic medical conditions.  Past Medical History:  Diagnosis Date  . Cervical cerclage suture present 12/15/2017  . Cervical insufficiency during pregnancy in second trimester, antepartum 01/18/2018  . Complication of anesthesia   . PONV (postoperative nausea and vomiting)   . 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 Preterm premature rupture of membranes (PPROM) with unknown onset of labor 05/27/2016  . SAB (spontaneous abortion) 05/28/2016  . Vaginal Pap smear, abnormal     Current Outpatient Medications  Medication Instructions  . Multiple Vitamins-Minerals (MULTIVITAMIN WITH MINERALS) tablet 1 tablet, Oral, Daily    Past Surgical History:  Procedure Laterality Date  . ABDOMINAL CERCLAGE N/A 07/07/2018   Procedure: CERCLAGE ABDOMINAL;  Surgeon: Marcelle Overlie, MD;  Location: [facility];  Service: Gynecology;  Laterality: N/A;  . CERVICAL CERCLAGE N/A 12/15/2017   Procedure: CERCLAGE CERVICAL;  Surgeon: Ranae Pila, MD;  Location: [facility] BIRTHING SUIT[facility];  Service: Gynecology;  Laterality: N/A;  spoke w/ Lisa-OK at 8:30am, BS Virtual Rm 2  . DILATION AND EVACUATION N/A 01/18/2018   Procedure: DILATATION AND EVACUATION;  Surgeon: Harold Hedge, MD;  Location: [facility]IRTHING SUIT[facility];  Service: Gynecology;  Laterality: N/A;  . LEEP      Family History  Problem Relation Age of Onset  . Hypertension Mother   . Hypertension Maternal Grandmother     Social History   Tobacco Use  . Smoking status: Never Smoker  . Smokeless tobacco: Never Used  Vaping Use  . Vaping Use: Never used  Substance Use Topics  . Alcohol use: No  . Drug use: No    Review of Systems:   Review of Systems    Constitutional: Negative for chills and fever.  HENT: Negative for congestion and sore throat.   Eyes: Negative for blurred vision and double vision.  Respiratory: Negative for cough and shortness of breath.   Gastrointestinal: Negative for abdominal pain, constipation, diarrhea, nausea and vomiting.  Genitourinary: Negative for dysuria and frequency.  Skin: Negative for itching and rash.  Neurological: Negative for dizziness and headaches.     Physical Exam: Vitals:   09/12/19 1439  BP: 130/85  Pulse: 90  Temp: 98.1 F (36.7 C)  TempSrc: Oral  SpO2: 100%  Weight: 293 lb 3.2 oz (133 kg)  Height: 5\' 4"  (1.626 m)   Constitutional: no acute distress, obese Head: atraumatic ENT: external ears normal Cardiovascular: regular rate and rhythm, normal heart sounds Pulmonary: effort normal, normal breath sounds bilaterally Abdominal: flat, nontender, no rebound tenderness, bowel sounds normal Skin: warm and dry Neurological: alert, no focal deficit Psychiatric: normal mood and affect  Assessment & Plan:   See Encounters Tab for problem based charting.  Patient seen with Dr. 

## 2019-09-12 NOTE — Assessment & Plan Note (Signed)
LMP was prior to her pregnancy in 2019. Previously had regular periods every 4 weeks and lasting 4-5 days. Notes that cerclage was placed in June 2020. After that did not have any periods until she returned to see her doctor in November, was started on medroxyprogesterone and had bleeding in November. No further periods until a light period in April, then skipped May and again had light periods in June and the end of July. She has been trying to get pregnant.   -pregnancy test -TSH -FSH -serum prolactin -DHES -patient will establish care at a OBGYN to continue workup/treatment for this

## 2019-09-12 NOTE — Assessment & Plan Note (Signed)
Patient reports a repeat Pap smear in 2019. She elects to follow up with her OBGYN for next Pap smear.

## 2019-09-12 NOTE — Assessment & Plan Note (Signed)
Has noted "funny movement" in her abdomen for over 1 year, occurs diffusely in different across her abdomen. States that it feels like fetal movements during prior pregnancies. No pain, increased gas, bowel changes, n/v. Has 1-2 BMs a day with normal consistency, this has been her baseline for many years.  -possibly pseudocyesis -monitor for further symptoms

## 2019-09-13 LAB — FOLLICLE STIMULATING HORMONE: FSH: 2.7 m[IU]/mL

## 2019-09-13 LAB — TSH: TSH: 1.4 u[IU]/mL (ref 0.450–4.500)

## 2019-09-13 LAB — LIPID PANEL
Chol/HDL Ratio: 4.8 ratio — ABNORMAL HIGH (ref 0.0–4.4)
Cholesterol, Total: 205 mg/dL — ABNORMAL HIGH (ref 100–199)
HDL: 43 mg/dL (ref >39)
LDL Chol Calc (NIH): 149 mg/dL — ABNORMAL HIGH (ref 0–99)
Triglycerides: 71 mg/dL (ref 0–149)
VLDL Cholesterol Cal: 13 mg/dL (ref 5–40)

## 2019-09-13 LAB — DHEA-SULFATE: DHEA-SO4: 187 ug/dL (ref 84.8–378.0)

## 2019-09-13 LAB — PROLACTIN: Prolactin: 15.1 ng/mL (ref 4.8–23.3)

## 2019-09-13 NOTE — Progress Notes (Signed)
[facility] Medicine Clinic Attending  I saw and evaluated the patient.  I personally confirmed the key portions of the history and exam documented by Dr. Chen and I reviewed pertinent patient test results.  The assessment, diagnosis, and plan were formulated together and I agree with the documentation in the resident's note.  Kristol Almanzar, M.D., Ph.D.  

## 2019-09-14 ENCOUNTER — Telehealth: Payer: Self-pay

## 2019-09-14 NOTE — Telephone Encounter (Signed)
Requesting lab results, please call pt back.  

## 2019-09-14 NOTE — Progress Notes (Signed)
Called patient and informed her of her lab results include pre-diabetic A1c and elevated cholesterol. No changes to plan required at this time. Would like to start patient on semaglutide for weight loss and concurrent pre-DM, pending her establishment with OBGYN to evaluate if this is recommended while trying to conceive.   Also discussed her lab work investigating her amenorrhea, which was unrevealing. Patient informed me that she began a period last night which seems like a normal amount of flow, as opposed to recent ones which were very light.

## 2020-04-26 DIAGNOSIS — Z20822 Contact with and (suspected) exposure to covid-19: Secondary | ICD-10-CM | POA: Diagnosis not present

## 2020-10-14 DIAGNOSIS — Z3202 Encounter for pregnancy test, result negative: Secondary | ICD-10-CM | POA: Diagnosis not present

## 2020-10-22 ENCOUNTER — Ambulatory Visit: Payer: Medicaid Other | Admitting: Family Medicine

## 2020-10-22 ENCOUNTER — Encounter: Payer: Self-pay | Admitting: Family Medicine

## 2020-10-22 VITALS — BP 128/90 | HR 93 | Ht 65.0 in | Wt 290.0 lb

## 2020-10-22 DIAGNOSIS — Z789 Other specified health status: Secondary | ICD-10-CM

## 2020-10-22 NOTE — Progress Notes (Signed)
  Subjective:     Patient ID: Melody Zamora, female   DOB: 11-Aug-1990, 30 y.o.   MRN: [id number]  HPI Melody Zamora presents to the [facility] health and wellness clinic today for her required wellness visit for her insurance. She does not have a PCP. She needs a pap smear, has an OBGYN she will call and schedule an appointment. States she does well with healthy eating but needs to work on exercise. She denies any problems or concerns today.   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  HENT:  Negative for congestion, ear pain, sinus pressure, sinus pain and sore throat.   Eyes:  Negative for discharge and visual disturbance.  Respiratory:  Negative for cough, shortness of breath and wheezing.   Cardiovascular:  Negative for chest pain and leg swelling.  Gastrointestinal:  Negative for abdominal pain, blood in stool, constipation, diarrhea, nausea and vomiting.  Genitourinary:  Negative for difficulty urinating and hematuria.  Skin:  Negative for color change.  Neurological:  Negative for dizziness, weakness, light-headedness and headaches.  Hematological:  Negative for adenopathy.  All other systems reviewed and are negative.     Objective:    Physical Exam Vitals reviewed.  Constitutional:      General: She is not in acute distress.    Appearance: Normal appearance. She is well-developed.  HENT:     Head: Normocephalic and atraumatic.  Eyes:     General:        Right eye: No discharge.        Left eye: No discharge.  Cardiovascular:     Rate and Rhythm: Normal rate and regular rhythm.     Heart sounds: Normal heart sounds.  Pulmonary:     Effort: Pulmonary effort is normal. No respiratory distress.     Breath sounds: Normal breath sounds.  Musculoskeletal:     Cervical back: Neck supple.  Skin:    General: Skin is warm and dry.  Neurological:     Mental Status: She is alert and oriented to person, place, and time.  Psychiatric:        Mood and Affect: Mood normal.        Behavior: Behavior normal.   Today's Vitals   10/22/20 1339  BP: 128/90  Pulse: 93  SpO2: 99%  Weight: 290 lb (131.5 kg)  Height: 5\' 5"  (1.651 m)   Body mass index is 48.26 kg/m.     Assessment:     Participant in health and wellness plan     Plan:     Establish care with PCP. Schedule appt for pap smear with OBGYN. Encouraged efforts at healthy eating and exercise. F/u here prn.

## 2020-10-22 NOTE — Patient Instructions (Signed)
-  Please get established with a primary care provider  -Call your OBGYN to schedule an appointment for a pap smear

## 2023-06-25 ENCOUNTER — Encounter: Payer: Self-pay | Admitting: Cardiology

## 2023-06-25 ENCOUNTER — Encounter: Payer: Self-pay | Admitting: Pharmacist

## 2023-06-25 ENCOUNTER — Ambulatory Visit: Admitting: Cardiology

## 2023-06-25 VITALS — BP 148/100 | Ht 64.0 in | Wt 294.6 lb

## 2023-06-25 DIAGNOSIS — I10 Essential (primary) hypertension: Secondary | ICD-10-CM

## 2023-06-25 DIAGNOSIS — R0609 Other forms of dyspnea: Secondary | ICD-10-CM | POA: Diagnosis not present

## 2023-06-25 DIAGNOSIS — Z01812 Encounter for preprocedural laboratory examination: Secondary | ICD-10-CM

## 2023-06-25 DIAGNOSIS — Z79899 Other long term (current) drug therapy: Secondary | ICD-10-CM

## 2023-06-25 DIAGNOSIS — Z7689 Persons encountering health services in other specified circumstances: Secondary | ICD-10-CM

## 2023-06-25 DIAGNOSIS — R072 Precordial pain: Secondary | ICD-10-CM

## 2023-06-25 MED ORDER — ATORVASTATIN CALCIUM 10 MG PO TABS: 10.0000 mg | ORAL_TABLET | Freq: Every day | ORAL | 3 refills | Status: AC

## 2023-06-25 MED ORDER — METOPROLOL TARTRATE 100 MG PO TABS: ORAL_TABLET | ORAL | 0 refills | Status: DC

## 2023-06-25 MED ORDER — LABETALOL HCL 100 MG PO TABS: 100.0000 mg | ORAL_TABLET | Freq: Two times a day (BID) | ORAL | 3 refills | Status: DC

## 2023-06-25 NOTE — Progress Notes (Signed)
 Cardio-Obstetrics Clinic  New Evaluation  Date:  07/04/2023   ID:  Melody Zamora, DOB Jun 13, 1990, MRN [id number]  PCP:  Patient, No Pcp Per   [facility] Providers Cardiologist:  Jerryl Morin, DO  Electrophysiologist:  None       Referring MD: Ivery Marking, MD   Chief Complaint: " my blood pressure has been high"  History of Present Illness:    Melody Zamora is a 32 y.o. female [G2P0010] who is being seen today for the evaluation of  at the request of Ivery Marking, MD.   Discussed the use of AI scribe software for clinical note transcription with the patient, who gave verbal consent to proceed  Medical hx of  hypertension who presents with chest pain. She was referred by her cousin, who is her primary doctor, for management of high blood pressure.  She experiences a squeezing sharp chest pain when lying down and upon waking, occurring six to seven times daily, without shortness of breath, lightheadedness, or dizziness. She is on amlodipine 5 mg for hypertension, with significant blood pressure increases if doses are missed. There is a family history of hypertension. Her cholesterol levels are total cholesterol 203 mg/dL, triglycerides 58 mg/dL, HDL 42 mg/dL, and LDL 782 mg/dL. She has prediabetes with a hemoglobin A1c of 6.0% and is on metformin.  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/2016   Chest Xray Negative   Preterm premature rupture of membranes (PPROM) with unknown onset of labor 05/27/2016   SAB (spontaneous abortion) 05/28/2016   Vaginal Pap smear, abnormal     Past Surgical History:  Procedure Laterality Date   ABDOMINAL CERCLAGE N/A 07/07/2018    Procedure: CERCLAGE ABDOMINAL;  Surgeon: Thurman Flores, MD;  Location: [facility];  Service: Gynecology;  Laterality: N/A;   CERVICAL CERCLAGE N/A 12/15/2017   Procedure: CERCLAGE CERVICAL;  Surgeon: Concepcion Deck, MD;  Location: [facility];  Service: Gynecology;  Laterality: N/A;  spoke w/ Lisa-OK at 8:30am, BS Virtual Rm 2   DILATION AND EVACUATION N/A 01/18/2018   Procedure: DILATATION AND EVACUATION;  Surgeon: Thora Flint, MD;  Location: [facility];  Service: Gynecology;  Laterality: N/A;   LEEP        OB History     Gravida  2   Para  1   Term      Preterm      AB  1   Living  0      SAB  1   IAB      Ectopic      Multiple  0   Live Births  1               Current Medications: Current Meds  Medication Sig   amLODipine (NORVASC) 5 MG tablet Take 5 mg by mouth daily.   atorvastatin  (LIPITOR) 10 MG tablet Take 1 tablet (10 mg total) by mouth daily.   labetalol  (NORMODYNE ) 100 MG tablet Take 1 tablet (100 mg total) by mouth 2 (two) times daily.   metFORMIN (GLUCOPHAGE) 500 MG tablet Take 500 mg by mouth in the morning, at noon, and at bedtime.   metoprolol  tartrate (LOPRESSOR ) 100 MG tablet Take 2 hours prior  to CT   Multiple Vitamins-Minerals (MULTIVITAMIN WITH MINERALS) tablet Take 1 tablet by mouth daily.     Allergies:   Plaquenil [hydroxychloroquine]   Social History   Socioeconomic History   Marital status: Single    Spouse name: Not on file   Number of children: Not on file   Years of education: Not on file   Highest education level: Not on file  Occupational History   Occupation: CNA  Tobacco Use   Smoking status: Never   Smokeless tobacco: Never  Vaping Use   Vaping status: Never Used  Substance and Sexual Activity   Alcohol use: No   Drug use: No   Sexual activity: Yes    Partners: Male  Other Topics Concern   Not on file  Social History Narrative   Not on file   Social Drivers of  Health   Financial Resource Strain: Low Risk  (12/08/2017)   Overall Financial Resource Strain (CARDIA)    Difficulty of Paying Living Expenses: Not hard at all  Food Insecurity: No Food Insecurity (12/08/2017)   Hunger Vital Sign    Worried About Running Out of Food in the Last Year: Never true    Ran Out of Food in the Last Year: Never true  Transportation Needs: Unknown (12/08/2017)   PRAPARE - Administrator, Civil Service (Medical): No    Lack of Transportation (Non-Medical): Not on file  Physical Activity: Not on file  Stress: No Stress Concern Present (12/08/2017)   [employer] of Occupational Health - Occupational Stress Questionnaire    Feeling of Stress : Only a little  Social Connections: Unknown (06/09/2021)   Received from [facility]   Social Network    Social Network: Not on file      Family History  Problem Relation Age of Onset   Hypertension Mother    Hypertension Maternal Grandmother       ROS:   Please see the history of present illness.     All other systems reviewed and are negative.   Labs/EKG Reviewed:    EKG:  EKG was ordered today.  The ekg ordered today demonstrates sinus tachycardia with evidence of left atrial enlargement   Recent Labs: No results found for requested labs within last 365 days.   Recent Lipid Panel Lab Results  Component Value Date/Time   CHOL 205 (H) 09/12/2019 04:20 PM   TRIG 71 09/12/2019 04:20 PM   HDL 43 09/12/2019 04:20 PM   CHOLHDL 4.8 (H) 09/12/2019 04:20 PM   LDLCALC 149 (H) 09/12/2019 04:20 PM    Physical Exam:    VS:  BP (!) 148/100   Ht 5\' 4"  (1.626 m)   Wt 294 lb 9.6 oz (133.6 kg)   BMI 50.57 kg/m     Wt Readings from Last 3 Encounters:  06/25/23 294 lb 9.6 oz (133.6 kg)  10/22/20 290 lb (131.5 kg)  09/12/19 293 lb 3.2 oz (133 kg)     GEN:  Well nourished, well developed in no acute distress HEENT: Normal NECK: No JVD; No carotid bruits LYMPHATICS: No  lymphadenopathy CARDIAC: RRR, no murmurs, rubs, gallops RESPIRATORY:  Clear to auscultation without rales, wheezing or rhonchi  ABDOMEN: Soft, non-tender, non-distended MUSCULOSKELETAL:  No edema; No deformity  SKIN: Warm and dry NEUROLOGIC:  Alert and oriented x 3 PSYCHIATRIC:  Normal affect    Risk Assessment/Risk Calculators:                 ASSESSMENT &  PLAN:    Hypertension Recently diagnosed with significant elevation. Requires management for future pregnancy plans. Family history present. Uncontrolled hypertension may lead to cardiac dilation. - Initiate labetalol  100 twice daily - Continue amlodipine 5 mg. - Regular home blood pressure monitoring. - Follow-up with pharmacist in 4-6 weeks. - Follow-up with cardiologist in 12-16 weeks.  Chest pain The symptoms chest pain is concerning, this patient does have intermediate risk for coronary artery disease and at this time I would like to pursue an ischemic evaluation in this patient.  Shared decision a coronary CTA at this time is appropriate.  I have discussed with the patient about the testing.  The patient has no IV contrast allergy and is agreeable to proceed with this test.   - Order echocardiogram to assess cardiac structure. - Order CT coronary angiogram to check for blockages, cancel if pregnant on test day.  Elevated LDL cholesterol LDL at 151 mg/dL. Discussed medication initiation before pregnancy. Advised dietary modifications. - Initiate atorvastatin  10 mg, discontinue if pregnancy is planned or occurs. - Provide Mediterranean diet information and salty six guidelines. - Schedule lipid panel in 8 weeks.  Prediabetes Hemoglobin A1c at 6.0%. Managed with metformin. - Continue metformin.  Patient Instructions  Medication Instructions:  Your physician has recommended you make the following change in your medication:  START: Labetalol  100 mg twice daily START: Atorvastatin  10 mg once daily  *If you need a  refill on your cardiac medications before your next appointment, please call your pharmacy*  Lab Work: Next week: BMET, Mag In 8 weeks: Lipids, Lp(a) If you have labs (blood work) drawn today and your tests are completely normal, you will receive your results only by: MyChart Message (if you have MyChart) OR A paper copy in the mail If you have any lab test that is abnormal or we need to change your treatment, we will call you to review the results.  Testing/Procedures: Your physician has requested that you have an echocardiogram. Echocardiography is a painless test that uses sound waves to create images of your heart. It provides your doctor with information about the size and shape of your heart and how well your heart's chambers and valves are working. This procedure takes approximately one hour. There are no restrictions for this procedure. Please do NOT wear cologne, perfume, aftershave, or lotions (deodorant is allowed). Please arrive 15 minutes prior to your appointment time.  Please note: We ask at that you not bring children with you during ultrasound (echo/ vascular) testing. Due to room size and safety concerns, children are not allowed in the ultrasound rooms during exams. Our front office staff cannot provide observation of children in our lobby area while testing is being conducted. An adult accompanying a patient to their appointment will only be allowed in the ultrasound room at the discretion of the ultrasound technician under special circumstances. We apologize for any inconvenience.    Your cardiac CT will be scheduled at one of the below locations:   [facility] [address] [place], [place] [address] [phone]  OR  Jeralene Mom. [facility] and Vascular Tower [address]  [place], [place] [address] Opening May 24, 2023  If scheduled at [facility], please arrive at the [facility] and Children's Entrance (Entrance C2) of [facility]  30 minutes prior to test start time. You can use the FREE valet parking offered at entrance C (encouraged to control the heart rate for the test)  Proceed to the  [facility] Radiology Department (first floor) to check-in and test prep.   All radiology patients and guests should use entrance C2 at [facility], accessed from [facility], even though the hospital's physical address listed is [address].    If scheduled at the Heart and Vascular Tower at [address], please enter the parking lot using the [address] entrance and use the FREE valet service at the patient drop-off area. Enter the buidling and check-in with registration on the main floor.  If scheduled at [facility] or [facility], please arrive 15 mins early for check-in and test prep.  There is spacious parking and easy access to the radiology department from the [facility] Heart and Vascular entrance. Please enter here and check-in with the desk attendant.   If scheduled at [facility], please arrive 30 minutes early for check-in and test prep.  Please follow these instructions carefully (unless otherwise directed):  An IV will be required for this test and Nitroglycerin will be given.  Hold all erectile dysfunction medications at least 3 days (72 hrs) prior to test. (Ie viagra, cialis, sildenafil, tadalafil, etc)   On the Night Before the Test: Be sure to Drink plenty of water. Do not consume any caffeinated/decaffeinated beverages or chocolate 12 hours prior to your test. Do not take any antihistamines 12 hours prior to your test.  On the Day of the Test: Drink plenty of water until 1 hour prior to the test. Do not eat any food 1 hour prior to test. You may take your regular medications prior to the test.  Take metoprolol  (Lopressor ) two hours prior to test. If you take Furosemide/Hydrochlorothiazide/Spironolactone/Chlorthalidone,  please HOLD on the morning of the test. Patients who wear a continuous glucose monitor MUST remove the device prior to scanning. FEMALES- please wear underwire-free bra if available, avoid dresses & tight clothing      After the Test: Drink plenty of water. After receiving IV contrast, you may experience a mild flushed feeling. This is normal. On occasion, you may experience a mild rash up to 24 hours after the test. This is not dangerous. If this occurs, you can take Benadryl 25 mg, Zyrtec, Claritin, or Allegra and increase your fluid intake. (Patients taking Tikosyn should avoid Benadryl, and may take Zyrtec, Claritin, or Allegra) If you experience trouble breathing, this can be serious. If it is severe call 911 IMMEDIATELY. If it is mild, please call our office.  We will call to schedule your test 2-4 weeks out understanding that some insurance companies will need an authorization prior to the service being performed.   For more information and frequently asked questions, please visit our website : [email or web address]  For non-scheduling related questions, please contact the cardiac imaging nurse navigator should you have any questions/concerns: Cardiac Imaging Nurse Navigators Direct Office Dial: [phone]   For scheduling needs, including cancellations and rescheduling, please call [place], [phone].   Follow-Up: At [facility], you and your health needs are our priority.  As part of our continuing mission to provide you with exceptional heart care, our providers are all part of one team.  This team includes your primary Cardiologist (physician) and Advanced Practice Providers or APPs (Physician Assistants and Nurse Practitioners) who all work together to provide you with the care you need, when you need it.  Your next appointment:   12-16 week(s)  Provider:   Hollister Wessler, DO    Other instructions: Mediterranean  Diet A Mediterranean diet is based  on the traditions of countries on the [employer]. It focuses on eating more: Fruits and vegetables. Whole grains, beans, nuts, and seeds. Heart-healthy fats. These are fats that are good for your heart. It involves eating less: Dairy. Meat and eggs. Processed foods with added sugar, salt, and fat. This type of diet can help prevent certain conditions. It can also improve outcomes if you have a long-term (chronic) disease, such as kidney or heart disease. What are tips for following this plan? Reading food labels Check packaged foods for: The serving size. For foods such as rice and pasta, the serving size is the amount of cooked product, not dry. The total fat. Avoid foods with saturated fat or trans fat. Added sugars, such as corn syrup. Shopping  Try to have a balanced diet. Buy a variety of foods, such as: Fresh fruits and vegetables. You may be able to get these from local farmers markets. You can also buy them frozen. Grains, beans, nuts, and seeds. Some of these can be bought in bulk. Fresh seafood. Poultry and eggs. Low-fat dairy products. Buy whole ingredients instead of foods that have already been packaged. If you can't get fresh seafood, buy precooked frozen shrimp or canned fish, such as tuna, salmon, or sardines. Stock your pantry so you always have certain foods on hand, such as olive oil, canned tuna, canned tomatoes, rice, pasta, and beans. Cooking Cook foods with extra-virgin olive oil instead of using butter or other vegetable oils. Have meat as a side dish. Have vegetables or grains as your main dish. This means having meat in small portions or adding small amounts of meat to foods like pasta or stew. Use beans or vegetables instead of meat in common dishes like chili or lasagna. Try out different cooking methods. Try roasting, broiling, steaming, and sauting vegetables. Add frozen vegetables to soups, stews, pasta, or rice. Add nuts or seeds for added  healthy fats and plant protein at each meal. You can add these to yogurt, salads, or vegetable dishes. Marinate fish or vegetables using olive oil, lemon juice, garlic, and fresh herbs. Meal planning Plan to eat a vegetarian meal one day each week. Try to work up to two vegetarian meals, if possible. Eat seafood two or more times a week. Have healthy snacks on hand. These may include: Vegetable sticks with hummus. Greek yogurt. Fruit and nut trail mix. Eat balanced meals. These should include: Fruit: 2-3 servings a day. Vegetables: 4-5 servings a day. Low-fat dairy: 2 servings a day. Fish, poultry, or lean meat: 1 serving a day. Beans and legumes: 2 or more servings a week. Nuts and seeds: 1-2 servings a day. Whole grains: 6-8 servings a day. Extra-virgin olive oil: 3-4 servings a day. Limit red meat and sweets to just a few servings a month. Lifestyle  Try to cook and eat meals with your family. Drink enough fluid to keep your pee (urine) pale yellow. Be active every day. This includes: Aerobic exercise, which is exercise that causes your heart to beat faster. Examples include running and swimming. Leisure activities like gardening, walking, or housework. Get 7-8 hours of sleep each night. Drink red wine if your provider says you can. A glass of wine is 5 oz (150 mL). You may be allowed to have: Up to 1 glass a day if you're female and not pregnant. Up to 2 glasses a day if you're female. What foods should I eat? Fruits Apples. Apricots. Avocado.  Berries. Bananas. Cherries. Dates. Figs. Grapes. Lemons. Melon. Oranges. Peaches. Plums. Pomegranate. Vegetables Artichokes. Beets. Broccoli. Cabbage. Carrots. Eggplant. Green beans. Chard. Kale. Spinach. Onions. Leeks. Peas. Squash. Tomatoes. Peppers. Radishes. Grains Whole-grain pasta. Brown rice. Bulgur wheat. Polenta. Couscous. Whole-wheat bread. Dwyane Glad. Meats and other proteins Beans. Almonds. Sunflower seeds. Pine nuts.  Peanuts. Cod. Salmon. Scallops. Shrimp. Tuna. Tilapia. Clams. Oysters. Eggs. Chicken or [place] without skin. Dairy Low-fat milk. Cheese. Greek yogurt. Fats and oils Extra-virgin olive oil. Avocado oil. Grapeseed oil. Beverages Water. Red wine. Herbal tea. Sweets and desserts Greek yogurt with honey. Baked apples. Poached pears. Trail mix. Seasonings and condiments Basil. Cilantro. Coriander. Cumin. Mint. Parsley. Sage. Rosemary. Tarragon. Garlic. Oregano. Thyme. Pepper. Balsamic vinegar. Tahini. Hummus. Tomato sauce. Olives. Mushrooms. The items listed above may not be all the foods and drinks you can have. Talk to a dietitian to learn more. What foods should I limit? This is a list of foods that should be eaten rarely. Fruits Fruit canned in syrup. Vegetables Deep-fried potatoes, like [place] fries. Grains Packaged pasta or rice dishes. Cereal with added sugar. Snacks with added sugar. Meats and other proteins Beef. Pork. Lamb. Chicken or [place] with skin. Hot dogs. Helene Loader. Dairy Ice cream. Sour cream. Whole milk. Fats and oils Butter. Canola oil. Vegetable oil. Beef fat (tallow). Lard. Beverages Juice. Sugar-sweetened soft drinks. Beer. Liquor and spirits. Sweets and desserts Cookies. Cakes. Pies. Candy. Seasonings and condiments Mayonnaise. Pre-made sauces and marinades. The items listed above may not be all the foods and drinks you should limit. Talk to a dietitian to learn more. Where to find more information American Heart Association (AHA): heart.org This information is not intended to replace advice given to you by your health care provider. Make sure you discuss any questions you have with your health care provider. Document Revised: 04/26/2022 Document Reviewed: 04/26/2022 Elsevier Patient Education  2024 Elsevier Inc.         Dispo:  No follow-ups on file.   Medication Adjustments/Labs and Tests Ordered: Current medicines are reviewed at length with the  patient today.  Concerns regarding medicines are outlined above.  Tests Ordered: Orders Placed This Encounter  Procedures   CT CORONARY MORPH W/CTA COR W/SCORE W/CA W/CM &/OR WO/CM   Lipid panel   Lipoprotein A (LPA)   Basic Metabolic Panel (BMET)   Magnesium   EKG 12-Lead   ECHOCARDIOGRAM COMPLETE   Medication Changes: Meds ordered this encounter  Medications   metoprolol  tartrate (LOPRESSOR ) 100 MG tablet    Sig: Take 2 hours prior to CT    Dispense:  1 tablet    Refill:  0   labetalol  (NORMODYNE ) 100 MG tablet    Sig: Take 1 tablet (100 mg total) by mouth 2 (two) times daily.    Dispense:  90 tablet    Refill:  3   atorvastatin  (LIPITOR) 10 MG tablet    Sig: Take 1 tablet (10 mg total) by mouth daily.    Dispense:  90 tablet    Refill:  3

## 2023-06-25 NOTE — Patient Instructions (Addendum)
 Medication Instructions:  Your physician has recommended you make the following change in your medication:  START: Labetalol 100 mg twice daily START: Atorvastatin 10 mg once daily  *If you need a refill on your cardiac medications before your next appointment, please call your pharmacy*  Lab Work: Next week: BMET, Mag In 8 weeks: Lipids, Lp(a) If you have labs (blood work) drawn today and your tests are completely normal, you will receive your results only by: MyChart Message (if you have MyChart) OR A paper copy in the mail If you have any lab test that is abnormal or we need to change your treatment, we will call you to review the results.  Testing/Procedures: Your physician has requested that you have an echocardiogram. Echocardiography is a painless test that uses sound waves to create images of your heart. It provides your doctor with information about the size and shape of your heart and how well your heart's chambers and valves are working. This procedure takes approximately one hour. There are no restrictions for this procedure. Please do NOT wear cologne, perfume, aftershave, or lotions (deodorant is allowed). Please arrive 15 minutes prior to your appointment time.  Please note: We ask at that you not bring children with you during ultrasound (echo/ vascular) testing. Due to room size and safety concerns, children are not allowed in the ultrasound rooms during exams. Our front office staff cannot provide observation of children in our lobby area while testing is being conducted. An adult accompanying a patient to their appointment will only be allowed in the ultrasound room at the discretion of the ultrasound technician under special circumstances. We apologize for any inconvenience.    Your cardiac CT will be scheduled at one of the below locations:   [facility] [address] [place], [place] [address] [phone]  OR  [facility]eral[facility] Mom. [facility] and V[facility]  Tower [address]  [place], [place] [address] Opening May 24, 2023  If scheduled at [facility], please arrive at the [facility] and Children's Entrance (Entrance C2) of [facility] 30 minutes prior to test start time. You can use the FREE valet parking offered at entrance C (encouraged to control the heart rate for the test)  Proceed to the [facility] Radiology Department (first floor) to check-in and test prep.   All radiology patients and guests should use entrance C2 at [facility], accessed from [facility], even though the hospital's physical address listed is [address]    If scheduled at the Heart and Vascular Tower at [address], please enter the parking lot using the [address] entrance and use the FREE valet service at the patient drop-off area. Enter the buidling and check-in with registration on the main floor.  If scheduled at [facility] or [facility], please arrive 15 mins early for check-in and test prep.  There is spacious parking and easy access to the radiology department from the [facility] Heart and Vascular entrance. Please enter here and check-in with the desk attendant.   If scheduled at [facility], please arrive 30 minutes early for check-in and test prep.  Please follow these instructions carefully (unless otherwise directed):  An IV will be required for this test and Nitroglycerin will be given.  Hold all erectile dysfunction medications at least 3 days (72 hrs) prior to test. (Ie viagra, cialis, sildenafil, tadalafil, etc)   On the Night Before the Test: Be sure to  Drink plenty of water. Do not consume any caffeinated/decaffeinated beverages or chocolate 12 hours prior to your test. Do not take any antihistamines 12 hours prior to your test.  On the Day of the Test: Drink plenty of water until 1 hour prior to the test. Do not eat any food 1  hour prior to test. You may take your regular medications prior to the test.  Take metoprolol (Lopressor) two hours prior to test. If you take Furosemide/Hydrochlorothiazide/Spironolactone/Chlorthalidone, please HOLD on the morning of the test. Patients who wear a continuous glucose monitor MUST remove the device prior to scanning. FEMALES- please wear underwire-free bra if available, avoid dresses & tight clothing      After the Test: Drink plenty of water. After receiving IV contrast, you may experience a mild flushed feeling. This is normal. On occasion, you may experience a mild rash up to 24 hours after the test. This is not dangerous. If this occurs, you can take Benadryl 25 mg, Zyrtec, Claritin, or Allegra and increase your fluid intake. (Patients taking Tikosyn should avoid Benadryl, and may take Zyrtec, Claritin, or Allegra) If you experience trouble breathing, this can be serious. If it is severe call 911 IMMEDIATELY. If it is mild, please call our office.  We will call to schedule your test 2-4 weeks out understanding that some insurance companies will need an authorization prior to the service being performed.   For more information and frequently asked questions, please visit our website : [email or web address]  For non-scheduling related questions, please contact the cardiac imaging nurse navigator should you have any questions/concerns: Cardiac Imaging Nurse Navigators Direct Office Dial: [phone]   For scheduling needs, including cancellations and rescheduling, please call [place], [phone].   Follow-Up: At [facility], you and your health needs are our priority.  As part of our continuing mission to provide you with exceptional heart care, our providers are all part of one team.  This team includes your primary Cardiologist (physician) and Advanced Practice Providers or APPs (Physician Assistants and Nurse Practitioners) who all work together  to provide you with the care you need, when you need it.  Your next appointment:   12-16 week(s)  Provider:   Kardie Tobb, DO    Other instructions: Mediterranean Diet A Mediterranean diet is based on the traditions of countries on the [place] Sea. It focuses on eating more: Fruits and vegetables. Whole grains, beans, nuts, and seeds. Heart-healthy fats. These are fats that are good for your heart. It involves eating less: Dairy. Meat and eggs. Processed foods with added sugar, salt, and fat. This type of diet can help prevent certain conditions. It can also improve outcomes if you have a long-term (chronic) disease, such as kidney or heart disease. What are tips for following this plan? Reading food labels Check packaged foods for: The serving size. For foods such as rice and pasta, the serving size is the amount of cooked product, not dry. The total fat. Avoid foods with saturated fat or trans fat. Added sugars, such as corn syrup. Shopping  Try to have a balanced diet. Buy a variety of foods, such as: Fresh fruits and vegetables. You may be able to get these from local farmers markets. You can also buy them frozen. Grains, beans, nuts, and seeds. Some of these can be bought in bulk. Fresh seafood. Poultry and eggs. Low-fat dairy products. Buy whole ingredients instead of foods that have already been packaged. If you can't get fresh seafood, buy precooked frozen  shrimp or canned fish, such as tuna, salmon, or sardines. Stock your pantry so you always have certain foods on hand, such as olive oil, canned tuna, canned tomatoes, rice, pasta, and beans. Cooking Cook foods with extra-virgin olive oil instead of using butter or other vegetable oils. Have meat as a side dish. Have vegetables or grains as your main dish. This means having meat in small portions or adding small amounts of meat to foods like pasta or stew. Use beans or vegetables instead of meat in common dishes  like chili or lasagna. Try out different cooking methods. Try roasting, broiling, steaming, and sauting vegetables. Add frozen vegetables to soups, stews, pasta, or rice. Add nuts or seeds for added healthy fats and plant protein at each meal. You can add these to yogurt, salads, or vegetable dishes. Marinate fish or vegetables using olive oil, lemon juice, garlic, and fresh herbs. Meal planning Plan to eat a vegetarian meal one day each week. Try to work up to two vegetarian meals, if possible. Eat seafood two or more times a week. Have healthy snacks on hand. These may include: Vegetable sticks with hummus. Greek yogurt. Fruit and nut trail mix. Eat balanced meals. These should include: Fruit: 2-3 servings a day. Vegetables: 4-5 servings a day. Low-fat dairy: 2 servings a day. Fish, poultry, or lean meat: 1 serving a day. Beans and legumes: 2 or more servings a week. Nuts and seeds: 1-2 servings a day. Whole grains: 6-8 servings a day. Extra-virgin olive oil: 3-4 servings a day. Limit red meat and sweets to just a few servings a month. Lifestyle  Try to cook and eat meals with your family. Drink enough fluid to keep your pee (urine) pale yellow. Be active every day. This includes: Aerobic exercise, which is exercise that causes your heart to beat faster. Examples include running and swimming. Leisure activities like gardening, walking, or housework. Get 7-8 hours of sleep each night. Drink red wine if your provider says you can. A glass of wine is 5 oz (150 mL). You may be allowed to have: Up to 1 glass a day if you're female and not pregnant. Up to 2 glasses a day if you're female. What foods should I eat? Fruits Apples. Apricots. Avocado. Berries. Bananas. Cherries. Dates. Figs. Grapes. Lemons. Melon. Oranges. Peaches. Plums. Pomegranate. Vegetables Artichokes. Beets. Broccoli. Cabbage. Carrots. Eggplant. Green beans. Chard. Kale. Spinach. Onions. Leeks. Peas. Squash.  Tomatoes. Peppers. Radishes. Grains Whole-grain pasta. Brown rice. Bulgur wheat. Polenta. Couscous. Whole-wheat bread. Dwyane Glad. Meats and other proteins Beans. Almonds. Sunflower seeds. Pine nuts. Peanuts. Cod. Salmon. Scallops. Shrimp. Tuna. Tilapia. Clams. Oysters. Eggs. Chicken or [place] without skin. Dairy Low-fat milk. Cheese. Greek yogurt. Fats and oils Extra-virgin olive oil. Avocado oil. Grapeseed oil. Beverages Water. Red wine. Herbal tea. Sweets and desserts Greek yogurt with honey. Baked apples. Poached pears. Trail mix. Seasonings and condiments Basil. Cilantro. Coriander. Cumin. Mint. Parsley. Sage. Rosemary. Tarragon. Garlic. Oregano. Thyme. Pepper. Balsamic vinegar. Tahini. Hummus. Tomato sauce. Olives. Mushrooms. The items listed above may not be all the foods and drinks you can have. Talk to a dietitian to learn more. What foods should I limit? This is a list of foods that should be eaten rarely. Fruits Fruit canned in syrup. Vegetables Deep-fried potatoes, like [place] fries. Grains Packaged pasta or rice dishes. Cereal with added sugar. Snacks with added sugar. Meats and other proteins Beef. Pork. Lamb. Chicken or [place] with skin. Hot dogs. Helene Loader. Dairy Ice cream. Sour cream. Whole  milk. Fats and oils Butter. Canola oil. Vegetable oil. Beef fat (tallow). Lard. Beverages Juice. Sugar-sweetened soft drinks. Beer. Liquor and spirits. Sweets and desserts Cookies. Cakes. Pies. Candy. Seasonings and condiments Mayonnaise. Pre-made sauces and marinades. The items listed above may not be all the foods and drinks you should limit. Talk to a dietitian to learn more. Where to find more information American Heart Association (AHA): heart.org This information is not intended to replace advice given to you by your health care provider. Make sure you discuss any questions you have with your health care provider. Document Revised: 04/26/2022 Document Reviewed:  04/26/2022 Elsevier Patient Education  2024 [employer].

## 2023-06-25 NOTE — Telephone Encounter (Signed)
 Skips  This encounter was created in error - please disregard.

## 2023-07-07 ENCOUNTER — Telehealth (HOSPITAL_COMMUNITY): Payer: Self-pay | Admitting: Emergency Medicine

## 2023-07-07 NOTE — Telephone Encounter (Signed)
 Reaching out to patient to offer assistance regarding upcoming cardiac imaging study; pt verbalizes understanding of appt date/time, parking situation and where to check in, pre-test NPO status and medications ordered, and verified current allergies; name and call back number provided for further questions should they arise Rockwell Alexandria RN Navigator Cardiac Imaging [facility] [phone] office [phone] cell

## 2023-07-08 ENCOUNTER — Ambulatory Visit (HOSPITAL_COMMUNITY)
Admission: RE | Admit: 2023-07-08 | Discharge: 2023-07-08 | Disposition: A | Discharge status: Home / Self Care | Source: Ambulatory Visit | Attending: Cardiology | Admitting: Cardiology

## 2023-07-08 DIAGNOSIS — R072 Precordial pain: Secondary | ICD-10-CM

## 2023-07-08 DIAGNOSIS — R079 Chest pain, unspecified: Secondary | ICD-10-CM | POA: Diagnosis present

## 2023-07-08 MED ORDER — METOPROLOL TARTRATE 5 MG/5ML IV SOLN: 10.0000 mg | Freq: Once | INTRAVENOUS | Status: DC | PRN

## 2023-07-08 MED ORDER — IOHEXOL 350 MG/ML SOLN
100.0000 mL | Freq: Once | INTRAVENOUS | Status: AC | PRN
  Administered 2023-07-08: 100 mL via INTRAVENOUS

## 2023-07-08 MED ORDER — DILTIAZEM HCL 25 MG/5ML IV SOLN: 10.0000 mg | INTRAVENOUS | Status: DC | PRN

## 2023-07-08 MED ORDER — NITROGLYCERIN 0.4 MG SL SUBL
0.8000 mg | SUBLINGUAL_TABLET | Freq: Once | SUBLINGUAL | Status: AC
  Administered 2023-07-08: 0.8 mg via SUBLINGUAL

## 2023-07-09 ENCOUNTER — Ambulatory Visit: Payer: Self-pay | Admitting: Cardiology

## 2023-07-16 ENCOUNTER — Ambulatory Visit: Admitting: Pharmacist

## 2023-07-16 NOTE — Progress Notes (Deleted)
 Patient ID: Carra Brindley                 DOB: 02-02-1990                      MRN: [id number]     HPI: Romonda Genther is a 33 y.o. female referred by Dr. Lesta Rater to Cardio OB clinic for HTN management and chest pain. PMH is significant for HTN, HLD, obesity, and chest pain.  Seen by Dr Emmette Harms on 5/30 and noted to be hypertensive in room. Patient started on labetalol  to be taken in addition to amlodipine. Due to chest pain and hyperlipidemia, patient also started on atorvastatin  10mg  daily. Coronary CT ordered which was normal. Patient not currently pregnant but is considering  Patient is G2P0020. OBGYN Dr Lesta Rater.    Current HTN meds:  Previously tried:  BP goal:   Family History:   Social History:   Diet:   Exercise:   Home BP readings:   Wt Readings from Last 3 Encounters:  06/25/23 294 lb 9.6 oz (133.6 kg)  10/22/20 290 lb (131.5 kg)  09/12/19 293 lb 3.2 oz (133 kg)   BP Readings from Last 3 Encounters:  07/08/23 130/83  06/25/23 (!) 148/100  10/22/20 128/90   Pulse Readings from Last 3 Encounters:  07/08/23 77  10/22/20 93  09/12/19 90    Renal function: CrCl cannot be calculated (Patient's most recent lab result is older than the maximum 21 days allowed.).  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     atorvastatin  (LIPITOR) 10 MG tablet Take 1 tablet (10 mg total) by mouth daily. 90 tablet 3   labetalol   (NORMODYNE ) 100 MG tablet Take 1 tablet (100 mg total) by mouth 2 (two) times daily. 90 tablet 3   metFORMIN (GLUCOPHAGE) 500 MG tablet Take 500 mg by mouth in the morning, at noon, and at bedtime.     Multiple Vitamins-Minerals (MULTIVITAMIN WITH MINERALS) tablet Take 1 tablet by mouth daily.     No current facility-administered medications on file prior to visit.    Allergies  Allergen Reactions   Plaquenil [Hydroxychloroquine] Itching    Unable to sleep for 3 days      Assessment/Plan:  1. Hypertension -

## 2023-08-12 ENCOUNTER — Ambulatory Visit (HOSPITAL_COMMUNITY)
Admission: RE | Admit: 2023-08-12 | Discharge: 2023-08-12 | Disposition: A | Discharge status: Home / Self Care | Source: Ambulatory Visit | Attending: Cardiology | Admitting: Cardiology

## 2023-08-12 DIAGNOSIS — R0609 Other forms of dyspnea: Secondary | ICD-10-CM | POA: Diagnosis present

## 2023-08-12 LAB — ECHOCARDIOGRAM COMPLETE
Area-P 1/2: 5.79 cm2
S' Lateral: 3 cm

## 2023-08-27 ENCOUNTER — Encounter: Payer: Self-pay | Admitting: Pharmacist

## 2023-08-27 ENCOUNTER — Ambulatory Visit: Attending: Cardiology | Admitting: Pharmacist

## 2023-08-27 ENCOUNTER — Other Ambulatory Visit (HOSPITAL_COMMUNITY): Payer: Self-pay

## 2023-08-27 VITALS — BP 126/85 | HR 87

## 2023-08-27 DIAGNOSIS — I10 Essential (primary) hypertension: Secondary | ICD-10-CM

## 2023-08-27 MED ORDER — LABETALOL HCL 200 MG PO TABS
200.0000 mg | ORAL_TABLET | Freq: Two times a day (BID) | ORAL | 2 refills | Status: AC
  Filled 2023-08-27: qty 60, 30d supply, fill #0

## 2023-08-27 NOTE — Progress Notes (Signed)
 Patient ID: Melody Zamora                 DOB: 10/08/1990                      MRN: [id number]     HPI: Shontae Lechtenberg is a 33 y.o. female referred by Dr. Rutherford to Cardio-OB clinic for preconception management. PMH is significant for HTN, HLD, and obesity.  Seen by Dr Sheena on 06/25/23. Hypertensive in room and reported chest pain. Started on atorvastatin . Labetalol , and ordered chest CT and Echo. CT and echo normal.  Presents today to discuss BP. 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 Will often check BP at work and is variable. Believes last reading was 152/90 but does not have a log.  Denies SOB, CP, or swelling. Not sleeping well. Does not go to bed to around 1:30am.   Is trying to eat breakfast more frequently. Choosing oatmeal, boiled eggs, and coffee. Has a deli at work and eats a salad. Reports she eats a heavy dinner but did not elaborate.   Patient still planning on becoming pregnant.  Current HTN meds:  Labetalol  100mg  BID Amlodipine 5mg  daily   Wt Readings from Last 3 Encounters:  06/25/23 294 lb 9.6 oz (133.6 kg)  10/22/20 290 lb (131.5 kg)  09/12/19 293 lb 3.2 oz (133 kg)   BP Readings from Last 3 Encounters:  07/08/23 130/83  06/25/23 (!) 148/100  10/22/20 128/90   Pulse Readings from Last 3 Encounters:  07/08/23 77  10/22/20 93  09/12/19 90    Renal function: CrCl cannot be calculated (Patient's most recent lab result is older than the maximum 21 days allowed.).  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     atorvastatin  (LIPITOR) 10 MG tablet Take 1 tablet (10 mg total) by mouth daily. 90 tablet 3   labetalol  (NORMODYNE ) 100 MG tablet Take 1 tablet (100 mg total) by mouth 2 (two) times daily. 90 tablet 3   metFORMIN (GLUCOPHAGE) 500 MG tablet Take 500 mg by mouth in the morning, at noon, and at bedtime.     Multiple Vitamins-Minerals (MULTIVITAMIN WITH MINERALS) tablet Take 1 tablet by mouth daily.     No current facility-administered medications on file prior to visit.    Allergies  Allergen Reactions   Plaquenil [Hydroxychloroquine] Itching    Unable to sleep for 3 days      Assessment/Plan:  1. Hypertension -  Patient BP in room 126/85 and patient feels well. Pulse rate of 87 bpm. Will increase labetalol  to 200mg  BID and continue amlodipine 5mg  daily.  Recommended patient begin exercising more frequently. Encouraged at least 30 minutes a day at least 5 days a week. Advised that her heavy evening meal may be a reason she has trouble sleeping. Encouraged small frequent meals. F/u with Dr Sheena in 3 weeks.  Continue amlodipine 5mg  daily Increase  labetalol  to 200mg  BID F/u with Dr Sheena on 8/22   Medford Bolk, PharmD, [place], CDCES, CPP [facility] [address] [place], [place] [address] Phone: [phone]; Fax: [phone] 08/27/2023 11:56 AM

## 2023-08-27 NOTE — Patient Instructions (Addendum)
 It was good seeing you again  Continue your amlodipine 5mg  once a day  Increase your labetalol  to 200mg  twice a day.  You can take two of your 100mg  tablets until they run out  We recommend increasing physical activity such as walking at least 30 minutes at least 5 days a week  Continue to check your blood pressure at home or work.  Please let us  know if you have any concerns  Follow up with Dr Sheena in 3 weeks  Medford Bolk, PharmD, BCACP, CDCES, CPP [facility] [address] [place], [place] [address] Phone: [phone]; Fax: [phone] 08/27/2023 10:34 AM

## 2023-09-17 ENCOUNTER — Ambulatory Visit: Payer: Self-pay | Attending: Cardiology | Admitting: Cardiology

## 2023-09-17 ENCOUNTER — Encounter: Payer: Self-pay | Admitting: Cardiology

## 2023-09-17 VITALS — BP 128/88 | HR 85 | Ht 65.0 in | Wt 299.4 lb

## 2023-09-17 DIAGNOSIS — R7303 Prediabetes: Secondary | ICD-10-CM | POA: Diagnosis not present

## 2023-09-17 DIAGNOSIS — E782 Mixed hyperlipidemia: Secondary | ICD-10-CM | POA: Diagnosis not present

## 2023-09-17 DIAGNOSIS — I10 Essential (primary) hypertension: Secondary | ICD-10-CM

## 2023-09-17 NOTE — Patient Instructions (Signed)

## 2023-09-21 NOTE — Progress Notes (Signed)
 Cardiology Office Note:    Date:  09/21/2023   ID:  Melody Zamora, DOB Jun 07, 1990, MRN [id number]  PCP:  Patient, No Pcp Per  Cardiologist:  Dub Huntsman, DO  Electrophysiologist:  None   Referring MD: No ref. provider found    I am doing well   History of Present Illness:    Melody Zamora is a 33 y.o. female with a hx of hypertension, prediabetes and hyperlipidemia here today for  a follow up visit. At her last visit she was experiencing chest pain and her blood pressure.  She was able to get her testing. We discussed her results prior. She offers no complaints today.   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/27/2016   Chest Xray Negative   Preterm premature rupture of membranes (PPROM) with unknown onset of labor 05/27/2016   SAB (spontaneous abortion) 05/28/2016   Vaginal Pap smear, abnormal     Past Surgical History:  Procedure Laterality Date   ABDOMINAL CERCLAGE N/A 07/07/2018   Procedure: CERCLAGE ABDOMINAL;  Surgeon: Mat Browning, MD;  Location: [facility] [place];  Service: Gynecology;  Laterality: N/A;   CERVICAL CERCLAGE N/A 12/15/2017   Procedure: CERCLAGE CERVICAL;  Surgeon: Marne Kelly Nest, MD;  Location: [facility]IRTHING SUIT[facility];  Service: Gynecology;  Laterality: N/A;  spoke w/ Lisa-OK at 8:30am, BS Virtual Rm 2   DILATION AND EVACUATION N/A 01/18/2018   Procedure: DILATATION AND EVACUATION;  Surgeon: Curlene Agent, MD;  Location: [facility] BIRTHING SUITES;  Service: Gynecology;  Laterality: N/A;   LEEP      Current Medications: Current Meds  Medication Sig   amLODipine (NORVASC) 5 MG tablet Take 5 mg by mouth daily.   atorvastatin  (LIPITOR) 10 MG tablet Take 1 tablet (10 mg total) by mouth daily.   labetalol  (NORMODYNE ) 200 MG  tablet Take 1 tablet (200 mg total) by mouth 2 (two) times daily.   metFORMIN (GLUCOPHAGE) 500 MG tablet Take 500 mg by mouth in the morning, at noon, and at bedtime.   Multiple Vitamins-Minerals (MULTIVITAMIN WITH MINERALS) tablet Take 1 tablet by mouth daily.     Allergies:   Plaquenil [hydroxychloroquine]   Social History   Socioeconomic History   Marital status: Single    Spouse name: Not on file   Number of children: Not on file   Years of education: Not on file   Highest education level: Not on file  Occupational History   Occupation: CNA  Tobacco Use   Smoking status: Never   Smokeless tobacco: Never  Vaping Use   Vaping status: Never Used  Substance and Sexual Activity   Alcohol use: No   Drug use: No   Sexual activity: Yes    Partners: Male  Other Topics Concern   Not on file  Social History Narrative   Not on file   Social Drivers of Health   Financial Resource Strain: Low Risk  (12/08/2017)   Overall Financial Resource Strain (CARDIA)    Difficulty of Paying Living Expenses: Not hard at all  Food Insecurity: No Food Insecurity (12/08/2017)   Hunger Vital Sign    Worried About Running Out of Food in the Last Year: Never true    Ran Out of Food in the Last Year: Never true  Transportation Needs: Unknown (12/08/2017)  PRAPARE - Administrator, Civil Service (Medical): No    Lack of Transportation (Non-Medical): Not on file  Physical Activity: Not on file  Stress: No Stress Concern Present (12/08/2017)   [employer] of Occupational Health - Occupational Stress Questionnaire    Feeling of Stress : Only a little  Social Connections: Unknown (06/09/2021)   Received from [facility]   Social Network    Social Network: Not on file     Family History: The patient's family history includes Hypertension in her maternal grandmother and mother.  ROS:   Review of Systems  Constitution: Negative for decreased appetite, fever and weight gain.   HENT: Negative for congestion, ear discharge, hoarse voice and sore throat.   Eyes: Negative for discharge, redness, vision loss in right eye and visual halos.  Cardiovascular: Negative for chest pain, dyspnea on exertion, leg swelling, orthopnea and palpitations.  Respiratory: Negative for cough, hemoptysis, shortness of breath and snoring.   Endocrine: Negative for heat intolerance and polyphagia.  Hematologic/Lymphatic: Negative for bleeding problem. Does not bruise/bleed easily.  Skin: Negative for flushing, nail changes, rash and suspicious lesions.  Musculoskeletal: Negative for arthritis, joint pain, muscle cramps, myalgias, neck pain and stiffness.  Gastrointestinal: Negative for abdominal pain, bowel incontinence, diarrhea and excessive appetite.  Genitourinary: Negative for decreased libido, genital sores and incomplete emptying.  Neurological: Negative for brief paralysis, focal weakness, headaches and loss of balance.  Psychiatric/Behavioral: Negative for altered mental status, depression and suicidal ideas.  Allergic/Immunologic: Negative for HIV exposure and persistent infections.    EKGs/Labs/Other Studies Reviewed:    The following studies were reviewed today:   EKG:  The ekg ordered today demonstrates   Recent Labs: No results found for requested labs within last 365 days.  Recent Lipid Panel    Component Value Date/Time   CHOL 205 (H) 09/12/2019 1620   TRIG 71 09/12/2019 1620   HDL 43 09/12/2019 1620   CHOLHDL 4.8 (H) 09/12/2019 1620   LDLCALC 149 (H) 09/12/2019 1620    Physical Exam:    VS:  BP 128/88 (BP Location: Left Arm, Patient Position: Sitting, Cuff Size: Large)   Pulse 85   Ht 5' 5 (1.651 m)   Wt 299 lb 5.8 oz (135.8 kg)   SpO2 99%   BMI 49.82 kg/m     Wt Readings from Last 3 Encounters:  09/17/23 299 lb 5.8 oz (135.8 kg)  06/25/23 294 lb 9.6 oz (133.6 kg)  10/22/20 290 lb (131.5 kg)     GEN: Well nourished, well developed in no acute  distress HEENT: Normal NECK: No JVD; No carotid bruits LYMPHATICS: No lymphadenopathy CARDIAC: S1S2 noted,RRR, no murmurs, rubs, gallops RESPIRATORY:  Clear to auscultation without rales, wheezing or rhonchi  ABDOMEN: Soft, non-tender, non-distended, +bowel sounds, no guarding. EXTREMITIES: No edema, No cyanosis, no clubbing MUSCULOSKELETAL:  No deformity  SKIN: Warm and dry NEUROLOGIC:  Alert and oriented x 3, non-focal PSYCHIATRIC:  Normal affect, good insight  ASSESSMENT:    1. Primary hypertension   2. Mixed hyperlipidemia   3. Prediabetes   4. Morbid obesity (HCC)    PLAN:     Hypertension - she is now on Amlodipine 5 mg daily and Labetaolol 200 mg BID. Her blood pressure at target - close watch on her DBP. We discussed decreasing salt intake.  Hyperlipidemia - she is on low dose lipitor and if she becomes pregnant this will be stopped.  Prediabetes - diet modification advised Morbid obesity -  advised on increase in exercise  The patient is in agreement with the above plan. The patient left the office in stable condition.  The patient will follow up in   Medication Adjustments/Labs and Tests Ordered: Current medicines are reviewed at length with the patient today.  Concerns regarding medicines are outlined above.  No orders of the defined types were placed in this encounter.  No orders of the defined types were placed in this encounter.   Patient Instructions  Medication Instructions:  Your physician recommends that you continue on your current medications as directed. Please refer to the Current Medication list given to you today.  *If you need a refill on your cardiac medications before your next appointment, please call your pharmacy*   Follow-Up: At [facility], you and your health needs are our priority.  As part of our continuing mission to provide you with exceptional heart care, our providers are all part of one team.  This team includes your  primary Cardiologist (physician) and Advanced Practice Providers or APPs (Physician Assistants and Nurse Practitioners) who all work together to provide you with the care you need, when you need it.  Your next appointment:   9 month(s)  Provider:   Terrius Gentile, DO       Adopting a Healthy Lifestyle.  Know what a healthy weight is for you (roughly BMI <25) and aim to maintain this   Aim for 7+ servings of fruits and vegetables daily   65-80+ fluid ounces of water or unsweet tea for healthy kidneys   Limit to max 1 drink of alcohol per day; avoid smoking/tobacco   Limit animal fats in diet for cholesterol and heart health - choose grass fed whenever available   Avoid highly processed foods, and foods high in saturated/trans fats   Aim for low stress - take time to unwind and care for your mental health   Aim for 150 min of moderate intensity exercise weekly for heart health, and weights twice weekly for bone health   Aim for 7-9 hours of sleep daily   When it comes to diets, agreement about the perfect plan isnt easy to find, even among the experts. Experts at the [facility] [employer] developed an idea known as the Healthy Eating Plate. Just imagine a plate divided into logical, healthy portions.   The emphasis is on diet quality:   Load up on vegetables and fruits - one-half of your plate: Aim for color and variety, and remember that potatoes dont count.   Go for whole grains - one-quarter of your plate: Whole wheat, barley, wheat berries, quinoa, oats, brown rice, and foods made with them. If you want pasta, go with whole wheat pasta.   Protein power - one-quarter of your plate: Fish, chicken, beans, and nuts are all healthy, versatile protein sources. Limit red meat.   The diet, however, does go beyond the plate, offering a few other suggestions.   Use healthy plant oils, such as olive, canola, soy, corn, sunflower and peanut. Check the labels, and avoid  partially hydrogenated oil, which have unhealthy trans fats.   If youre thirsty, drink water. Coffee and tea are good in moderation, but skip sugary drinks and limit milk and dairy products to one or two daily servings.   The type of carbohydrate in the diet is more important than the amount. Some sources of carbohydrates, such as vegetables, fruits, whole grains, and beans-are healthier than others.   Finally, stay active  Signed, Valor Turber[place], DO  09/21/2023 2:56 PM    [facility]

## 2023-10-12 ENCOUNTER — Ambulatory Visit: Admitting: Nurse Practitioner

## 2023-10-19 ENCOUNTER — Ambulatory Visit: Admitting: Nurse Practitioner

## 2023-10-19 ENCOUNTER — Encounter: Payer: Self-pay | Admitting: Nurse Practitioner

## 2023-10-19 VITALS — BP 124/86 | HR 84

## 2023-10-19 DIAGNOSIS — Z789 Other specified health status: Secondary | ICD-10-CM

## 2023-10-19 NOTE — Progress Notes (Signed)
 Occupational Health- [facility]  Subjective:  Patient ID: Melody Zamora, female    DOB: 01-02-1991  Age: 33 y.o. MRN: [id number]  CC: wellness exam   HPI [name] presents for wellness exam visit for insurance benefit.  Patient has a PCP: No PCP , follows Dr. Rutherford with OBGYN, referred to Dr.Tobb for HTN management as she is planning to get pregnant.  PMH significant for: HTN. Prediabetes.  Last labs per PCP were completed: April 2025  Health Maintenance:  Colonoscopy: denies family hx.  Mammogram: Mom has breast cancer, diagnosed at 22.  Pap: up to date.     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/27/2016   Chest Xray Negative   Preterm premature rupture of membranes (PPROM) with unknown onset of labor 05/27/2016   SAB (spontaneous abortion) 05/28/2016   Vaginal Pap smear, abnormal     Past Surgical History:  Procedure Laterality Date   ABDOMINAL CERCLAGE N/A 07/07/2018   Procedure: CERCLAGE ABDOMINAL;  Surgeon: Mat Browning, MD;  Location: [facility] [place];  Service: Gynecology;  Laterality: N/A;   CERVICAL CERCLAGE N/A 12/15/2017   Procedure: CERCLAGE CERVICAL;  Surgeon: Marne Kelly Nest, MD;  Location: [facility] BIRTHING SUITES;  Service: Gynecology;  Laterality: N/A;  spoke w/ Lisa-OK at 8:30am, BS Virtual Rm 2   DILATION AND EVACUATION N/A 01/18/2018   Procedure: DILATATION AND EVACUATION;  Surgeon: Curlene Agent, MD;  Location: [facility] BIRTHING SUIT[facility];  Service: Gynecology;  Laterality: N/A;   LEEP      Outpatient Medications Prior to Visit  Medication Sig Dispense Refill   amLODipine (NORVASC) 5 MG  tablet Take 5 mg by mouth daily.     atorvastatin  (LIPITOR) 10 MG tablet Take 1 tablet (10 mg total) by mouth daily. 90 tablet 3   labetalol  (NORMODYNE ) 200 MG tablet Take 1 tablet (200 mg total) by mouth 2 (two) times daily. 60 tablet 2   metFORMIN (GLUCOPHAGE) 500 MG tablet Take 500 mg by mouth in the morning, at noon, and at bedtime.     Multiple Vitamins-Minerals (MULTIVITAMIN WITH MINERALS) tablet Take 1 tablet by mouth daily. (Patient not taking: Reported on 10/19/2023)     No facility-administered medications prior to visit.    ROS Review of Systems  HENT:  Negative for hearing loss.   Eyes:  Negative for visual disturbance.  Respiratory:  Negative for shortness of breath.   Cardiovascular:  Negative for chest pain and leg swelling.  Gastrointestinal:  Negative for constipation, diarrhea, nausea and vomiting.  Musculoskeletal:  Negative for arthralgias and back pain.  Neurological:  Negative for dizziness and headaches.    Objective:  BP 124/86 (BP Location: Left Arm, Patient Position: Sitting, Cuff Size: Large)   Pulse 84   LMP 10/03/2023 (Exact Date)   SpO2 100%   Physical Exam Constitutional:      General: She is not in acute distress. HENT:     Head: Normocephalic.     Right Ear: Tympanic membrane, ear canal and external ear normal.     Left Ear: Tympanic membrane, ear canal and external ear normal.     Mouth/Throat:  Pharynx: No oropharyngeal exudate or posterior oropharyngeal erythema.  Eyes:     Pupils: Pupils are equal, round, and reactive to light.  Cardiovascular:     Rate and Rhythm: Normal rate and regular rhythm.     Heart sounds: Normal heart sounds.  Pulmonary:     Effort: Pulmonary effort is normal.     Breath sounds: Normal breath sounds.  Abdominal:     General: Bowel sounds are normal.  Musculoskeletal:        General: Normal range of motion.     Right lower leg: No edema.     Left lower leg: No edema.  Neurological:     General: No focal  deficit present.     Mental Status: She is alert and oriented to person, place, and time. Mental status is at baseline.  Psychiatric:        Mood and Affect: Mood normal.        Behavior: Behavior normal.        Thought Content: Thought content normal.      Assessment & Plan:    Melody Zamora was seen today for wellness exam.  Diagnoses and all orders for this visit:  Participant in health and wellness plan Adult wellness physical was conducted today. Importance of diet and exercise were discussed in detail.  We reviewed immunizations and gave recommendations regarding current immunization needed for age.  Preventative health exams are up to date.  Patient was advised yearly wellness exam and follow-up with PCP as needed.     No orders of the defined types were placed in this encounter.   No orders of the defined types were placed in this encounter.   Follow-up: as needed.

## 2024-02-04 ENCOUNTER — Other Ambulatory Visit: Payer: Self-pay | Admitting: Cardiology
